# Patient Record
Sex: Male | Born: 1975 | Race: White | Hispanic: No | Marital: Single | Smoking: Former smoker
Health system: Southern US, Community
[De-identification: ages and names within clinical notes are randomized; demographics above are authoritative.]

## PROBLEM LIST (undated history)

## (undated) DIAGNOSIS — F429 Obsessive-compulsive disorder, unspecified: Secondary | ICD-10-CM

## (undated) DIAGNOSIS — Z8619 Personal history of other infectious and parasitic diseases: Secondary | ICD-10-CM

## (undated) DIAGNOSIS — F329 Major depressive disorder, single episode, unspecified: Secondary | ICD-10-CM

## (undated) DIAGNOSIS — F319 Bipolar disorder, unspecified: Secondary | ICD-10-CM

## (undated) DIAGNOSIS — F1910 Other psychoactive substance abuse, uncomplicated: Secondary | ICD-10-CM

## (undated) DIAGNOSIS — F4310 Post-traumatic stress disorder, unspecified: Secondary | ICD-10-CM

## (undated) DIAGNOSIS — F32A Depression, unspecified: Secondary | ICD-10-CM

## (undated) DIAGNOSIS — I10 Essential (primary) hypertension: Secondary | ICD-10-CM

## (undated) DIAGNOSIS — F419 Anxiety disorder, unspecified: Secondary | ICD-10-CM

## (undated) HISTORY — DX: Post-traumatic stress disorder, unspecified: F43.10

## (undated) HISTORY — DX: Obsessive-compulsive disorder, unspecified: F42.9

## (undated) HISTORY — DX: Personal history of other infectious and parasitic diseases: Z86.19

## (undated) HISTORY — DX: Other psychoactive substance abuse, uncomplicated: F19.10

## (undated) HISTORY — DX: Bipolar disorder, unspecified: F31.9

## (undated) HISTORY — PX: OTHER SURGICAL HISTORY: SHX169

---

## 1999-05-26 ENCOUNTER — Inpatient Hospital Stay (HOSPITAL_COMMUNITY): Admission: EM | Admit: 1999-05-26 | Discharge: 1999-05-31 | Payer: Self-pay | Admitting: *Deleted

## 1999-06-01 ENCOUNTER — Other Ambulatory Visit: Admission: RE | Admit: 1999-06-01 | Discharge: 1999-06-20 | Payer: Self-pay

## 2004-08-03 ENCOUNTER — Emergency Department (HOSPITAL_COMMUNITY): Admission: EM | Admit: 2004-08-03 | Discharge: 2004-08-03 | Payer: Self-pay | Admitting: Emergency Medicine

## 2009-08-11 ENCOUNTER — Ambulatory Visit (HOSPITAL_COMMUNITY): Admission: RE | Admit: 2009-08-11 | Discharge: 2009-08-11 | Payer: Self-pay | Admitting: Family Medicine

## 2010-05-13 ENCOUNTER — Encounter: Payer: Self-pay | Admitting: Family Medicine

## 2010-11-06 IMAGING — US US ABDOMEN COMPLETE
1 series · 14 of 25 positions shown · non-contrast
Comparison: None

CLINICAL DATA: Right upper quadrant pain

ULTRASOUND ABDOMEN:
TECHNIQUE: Sonography of upper abdominal structures was performed.

[Series 1: us abdomen complete · 0.32mm/px · 14 of 92 slices shown]
[im 1/92]
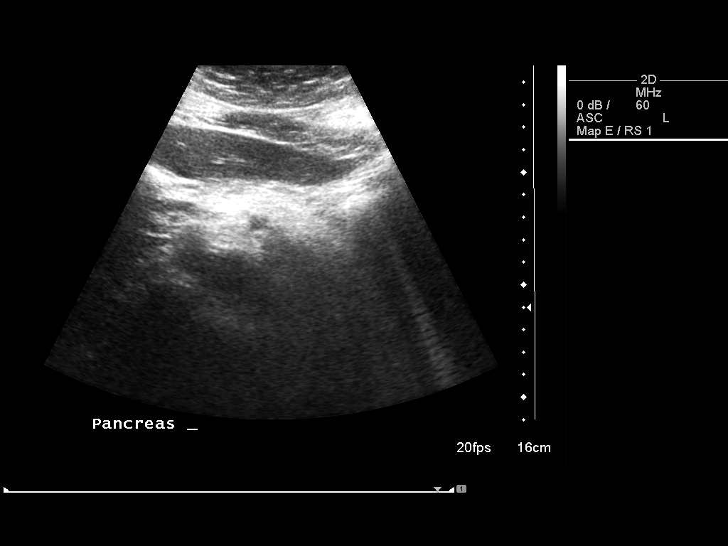
[im 8/92]
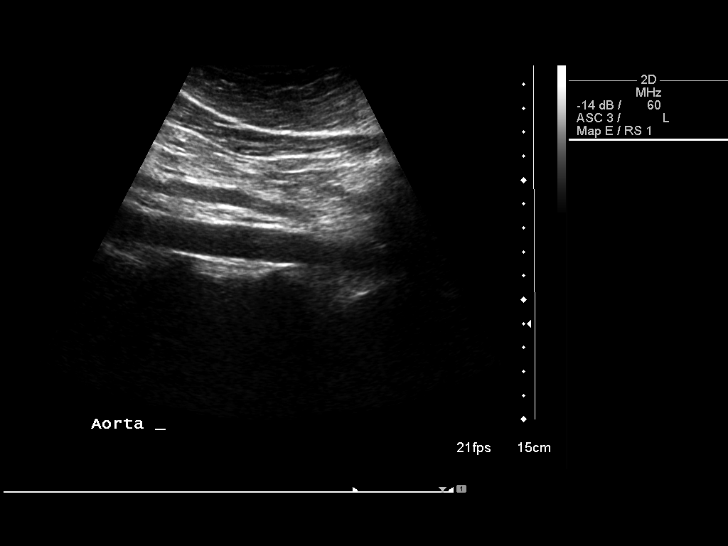
[im 16/92]
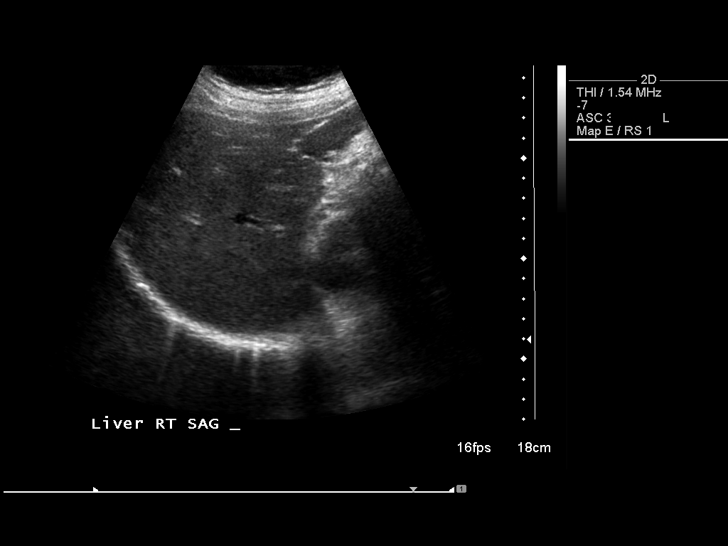
[im 23/92]
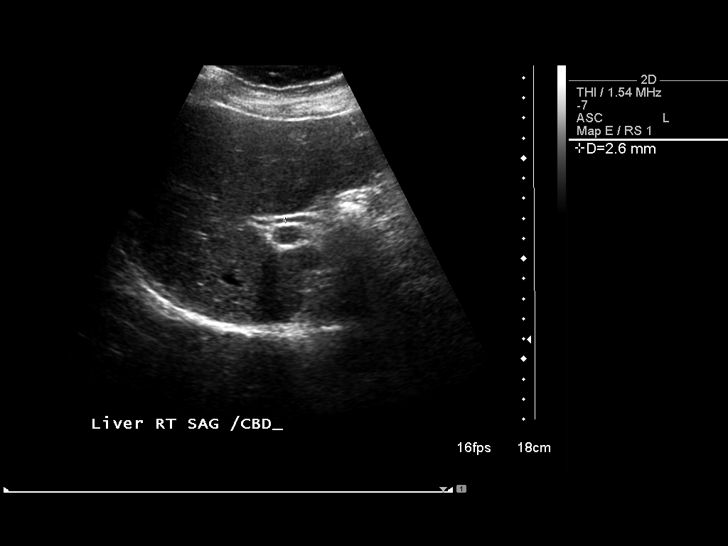
[im 31/92]
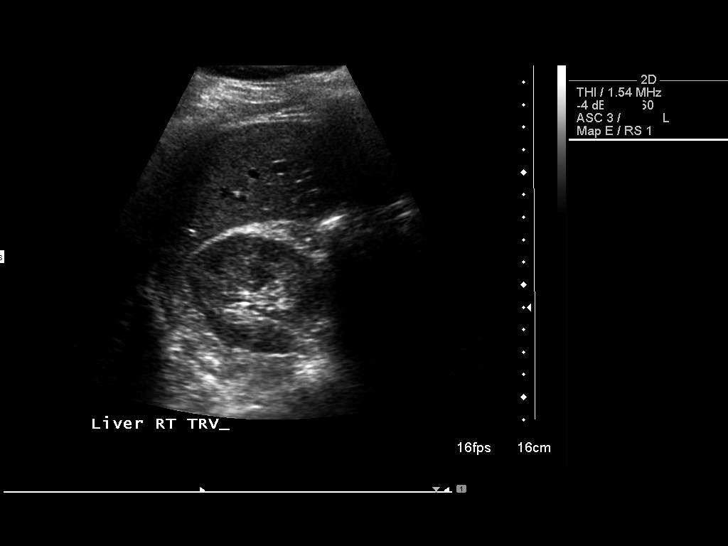
[im 35/92]
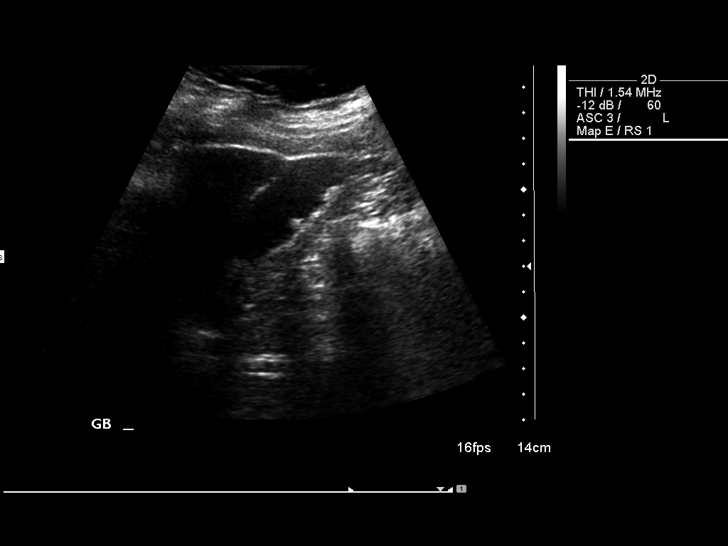
[im 42/92]
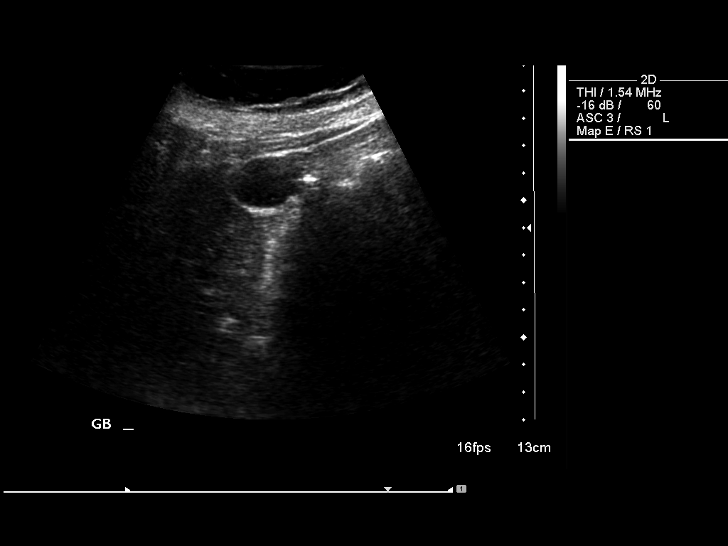
[im 50/92]
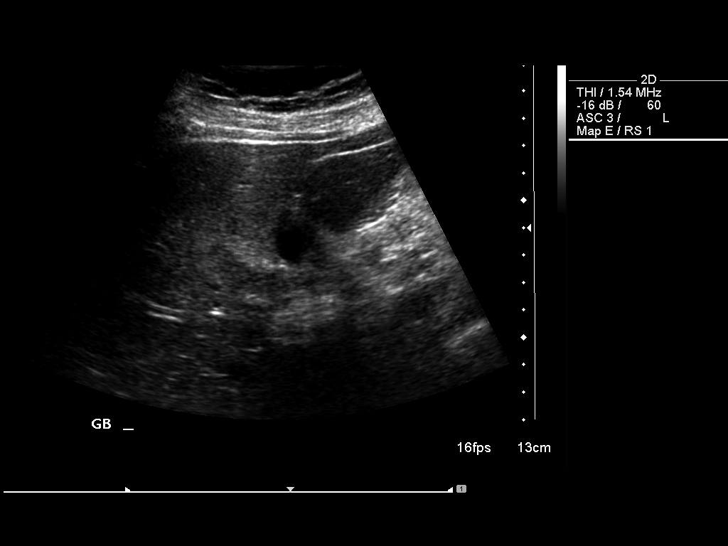
[im 57/92]
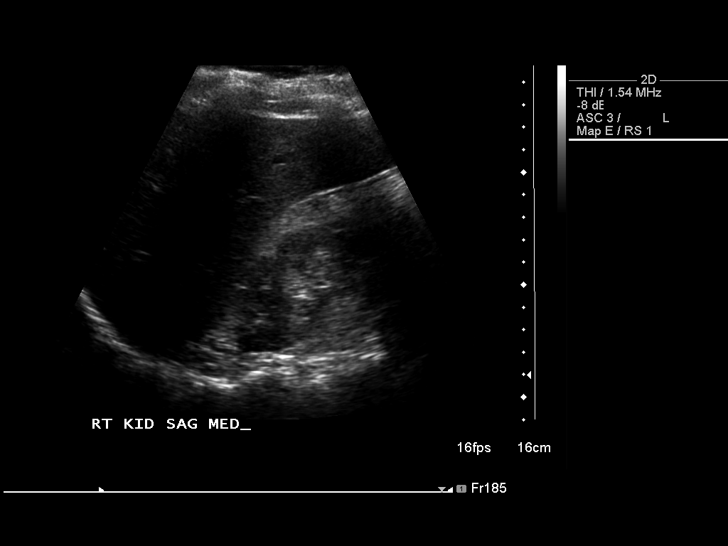
[im 61/92]
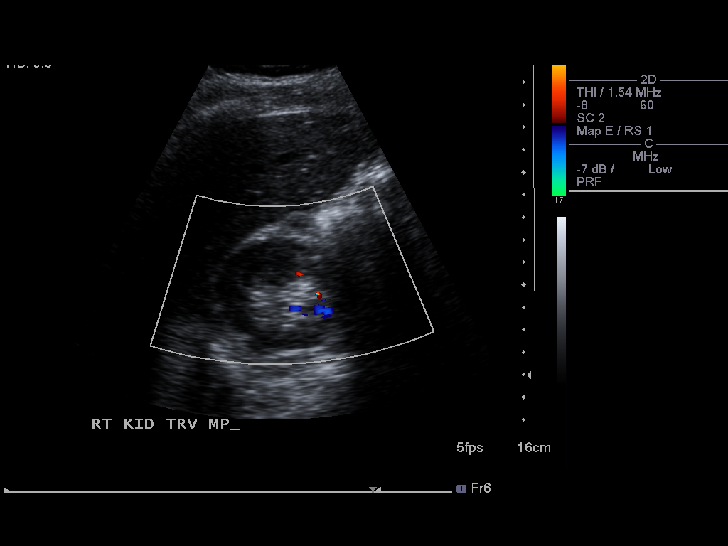
[im 69/92]
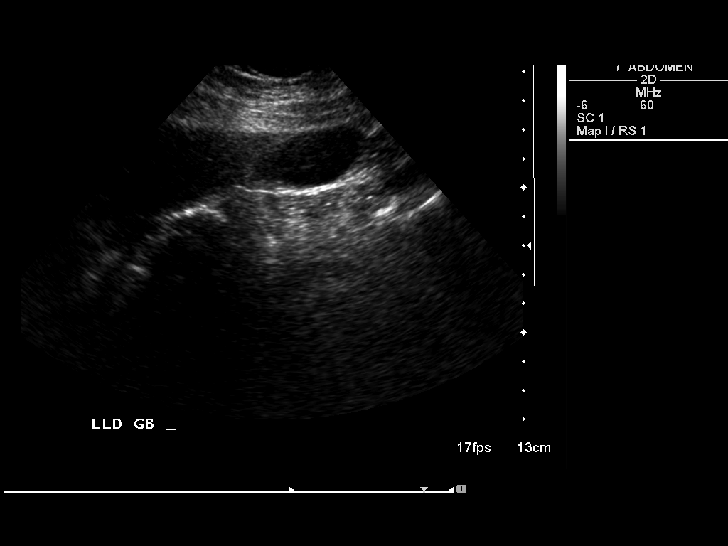
[im 76/92]
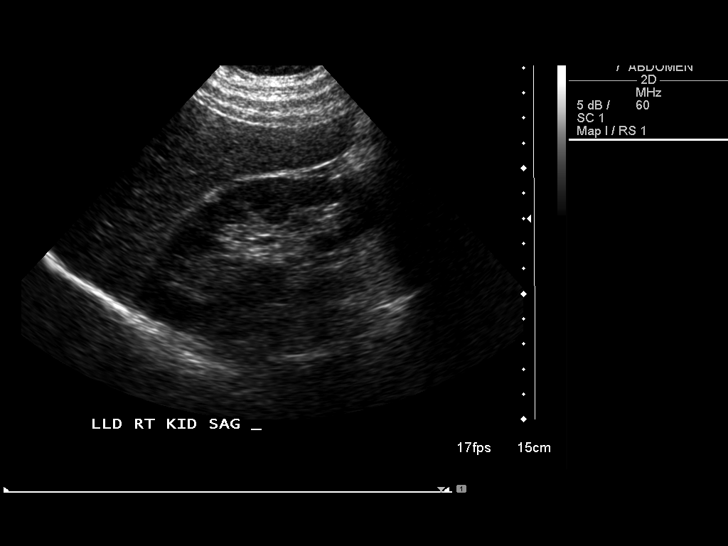
[im 84/92]
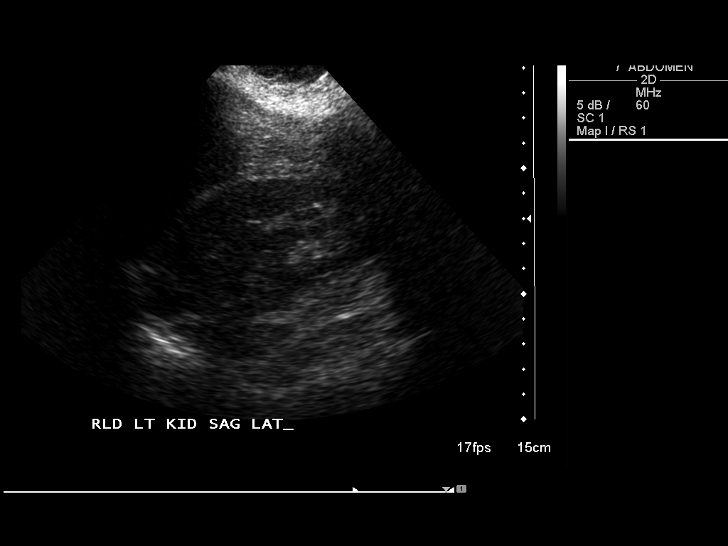
[im 92/92]
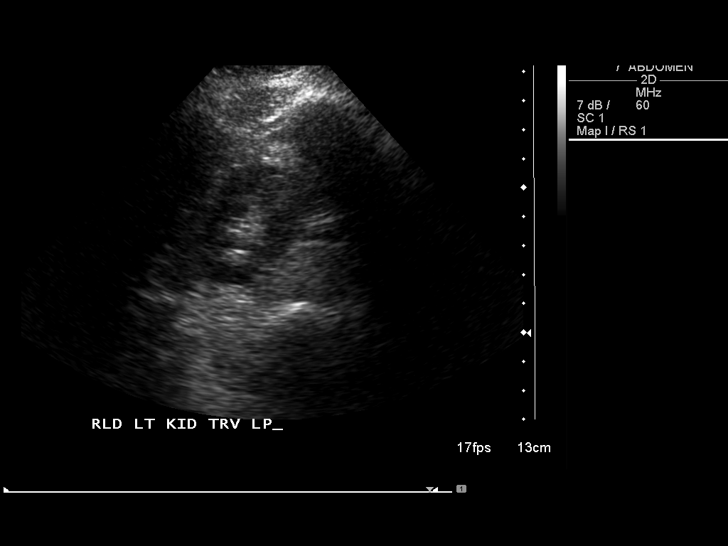

[14 of 25 positions shown; findings below may reference images not displayed]

Gallbladder:  Echogenic foci are seen within the gallbladder lumen,
non mobile.  Several these are associated with subtle posterior
shadowing.  These may represent non mobile calculi or less likely
tiny polyps.  No gallbladder wall thickening or pericholecystic
fluid.  No sonographic Murphy sign.

Common bile duct:  3 mm diameter, normal

Liver:  Normal appearance

IVC:  Unremarkable

Pancreas:  Normal appearance

Spleen:  Normal appearance, 6.8 cm length

Right kidney:  10.7 cm length.  Normal morphology without mass or
hydronephrosis.

Left kidney:  10.3 cm length.  Normal morphology without mass or
hydronephrosis.

Aorta:  Unremarkable

Other:  No free fluid
IMPRESSION: Non mobile echogenic foci within gallbladder lumen, some which are
associated with subtle posterior acoustic shadowing, question non
mobile calculi or polyps.
No evidence of acute cholecystitis or biliary obstruction.
Remainder of exam unremarkable.

## 2012-06-18 ENCOUNTER — Emergency Department (HOSPITAL_COMMUNITY)
Admission: EM | Admit: 2012-06-18 | Discharge: 2012-06-19 | Disposition: A | Discharge status: Other Acute Inpatient Hospital | Payer: BC Managed Care – PPO | Attending: Emergency Medicine | Admitting: Emergency Medicine

## 2012-06-18 ENCOUNTER — Encounter (HOSPITAL_COMMUNITY): Payer: Self-pay | Admitting: *Deleted

## 2012-06-18 DIAGNOSIS — F17200 Nicotine dependence, unspecified, uncomplicated: Secondary | ICD-10-CM | POA: Insufficient documentation

## 2012-06-18 DIAGNOSIS — F3289 Other specified depressive episodes: Secondary | ICD-10-CM | POA: Insufficient documentation

## 2012-06-18 DIAGNOSIS — R51 Headache: Secondary | ICD-10-CM | POA: Insufficient documentation

## 2012-06-18 DIAGNOSIS — R45851 Suicidal ideations: Secondary | ICD-10-CM

## 2012-06-18 DIAGNOSIS — I10 Essential (primary) hypertension: Secondary | ICD-10-CM | POA: Insufficient documentation

## 2012-06-18 DIAGNOSIS — F329 Major depressive disorder, single episode, unspecified: Secondary | ICD-10-CM

## 2012-06-18 DIAGNOSIS — T1491XA Suicide attempt, initial encounter: Secondary | ICD-10-CM

## 2012-06-18 DIAGNOSIS — F411 Generalized anxiety disorder: Secondary | ICD-10-CM | POA: Insufficient documentation

## 2012-06-18 HISTORY — DX: Essential (primary) hypertension: I10

## 2012-06-18 HISTORY — DX: Anxiety disorder, unspecified: F41.9

## 2012-06-18 HISTORY — DX: Major depressive disorder, single episode, unspecified: F32.9

## 2012-06-18 HISTORY — DX: Depression, unspecified: F32.A

## 2012-06-18 LAB — CBC WITH DIFFERENTIAL/PLATELET
Basophils Relative: 0 % (ref 0–1)
Eosinophils Absolute: 0.3 10*3/uL (ref 0.0–0.7)
HCT: 43.4 % (ref 39.0–52.0)
Lymphs Abs: 2.2 10*3/uL (ref 0.7–4.0)
Monocytes Absolute: 0.3 10*3/uL (ref 0.1–1.0)
Neutro Abs: 4.1 10*3/uL (ref 1.7–7.7)
Platelets: 164 10*3/uL (ref 150–400)
WBC: 6.8 10*3/uL (ref 4.0–10.5)

## 2012-06-18 LAB — RAPID URINE DRUG SCREEN, HOSP PERFORMED
Amphetamines: NOT DETECTED
Opiates: NOT DETECTED
Tetrahydrocannabinol: NOT DETECTED

## 2012-06-18 LAB — BASIC METABOLIC PANEL
CO2: 27 mEq/L (ref 19–32)
Calcium: 9.2 mg/dL (ref 8.4–10.5)
Chloride: 99 mEq/L (ref 96–112)
GFR calc Af Amer: >90 mL/min (ref >90)
GFR calc non Af Amer: >90 mL/min (ref >90)

## 2012-06-18 LAB — ETHANOL: Alcohol, Ethyl (B): <11 mg/dL (ref 0–11)

## 2012-06-18 MED ORDER — ALUM & MAG HYDROXIDE-SIMETH 200-200-20 MG/5ML PO SUSP: 30.0000 mL | ORAL | Status: DC | PRN

## 2012-06-18 MED ORDER — ONDANSETRON HCL 4 MG PO TABS: 4.0000 mg | ORAL_TABLET | Freq: Three times a day (TID) | ORAL | Status: DC | PRN

## 2012-06-18 MED ORDER — LORAZEPAM 1 MG PO TABS: 1.0000 mg | ORAL_TABLET | Freq: Three times a day (TID) | ORAL | Status: DC | PRN

## 2012-06-18 MED ORDER — IBUPROFEN 400 MG PO TABS: 400.0000 mg | ORAL_TABLET | Freq: Three times a day (TID) | ORAL | Status: DC | PRN

## 2012-06-18 MED ORDER — ZOLPIDEM TARTRATE 5 MG PO TABS: 5.0000 mg | ORAL_TABLET | Freq: Every evening | ORAL | Status: DC | PRN

## 2012-06-18 MED ORDER — ACETAMINOPHEN 325 MG PO TABS: 650.0000 mg | ORAL_TABLET | ORAL | Status: DC | PRN

## 2012-06-18 MED ORDER — NICOTINE 21 MG/24HR TD PT24: 21.0000 mg | MEDICATED_PATCH | Freq: Every day | TRANSDERMAL | Status: DC | PRN

## 2012-06-18 NOTE — ED Notes (Signed)
Barbara from [facility] called to accept pt but cannot take the patient without the petition. Per Samson Frederic if the police officer cannot locate the petition we have to wait for the clerk of court to get [place] a copy in the morning.
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
## 2012-06-18 NOTE — ED Notes (Signed)
Pt here in custody of police with IVC papers.  Alert, NAD

## 2012-06-18 NOTE — ED Notes (Signed)
Ella from [facility] unable to locate pt's petition, PD called to locate, if unable to locate the clerk of court will have to provide a copy tomorrow before information can be faxed.

## 2012-06-18 NOTE — [facility] Counselor (Signed)
Patient accepted to [facility] by Dr. Ferol Luz to Dr. Daleen Bo and will be going into 502-2.

## 2012-06-18 NOTE — ED Notes (Signed)
Belongings bagged by deanna, nt, wanded by security, sitter at bedside and room cleared for safety.

## 2012-06-19 ENCOUNTER — Inpatient Hospital Stay (HOSPITAL_COMMUNITY)
Admission: EM | Admit: 2012-06-19 | Discharge: 2012-06-26 | DRG: 430 | Disposition: A | Discharge status: Home / Self Care | Payer: BC Managed Care – PPO | Source: Intra-hospital | Attending: Psychiatry | Admitting: Psychiatry

## 2012-06-19 ENCOUNTER — Encounter (HOSPITAL_COMMUNITY): Payer: Self-pay | Admitting: *Deleted

## 2012-06-19 DIAGNOSIS — I10 Essential (primary) hypertension: Secondary | ICD-10-CM | POA: Diagnosis present

## 2012-06-19 DIAGNOSIS — F331 Major depressive disorder, recurrent, moderate: Secondary | ICD-10-CM | POA: Diagnosis present

## 2012-06-19 DIAGNOSIS — R45851 Suicidal ideations: Secondary | ICD-10-CM

## 2012-06-19 DIAGNOSIS — F411 Generalized anxiety disorder: Secondary | ICD-10-CM | POA: Diagnosis present

## 2012-06-19 DIAGNOSIS — F4310 Post-traumatic stress disorder, unspecified: Secondary | ICD-10-CM | POA: Diagnosis present

## 2012-06-19 DIAGNOSIS — F332 Major depressive disorder, recurrent severe without psychotic features: Principal | ICD-10-CM | POA: Diagnosis present

## 2012-06-19 DIAGNOSIS — Z79899 Other long term (current) drug therapy: Secondary | ICD-10-CM

## 2012-06-19 MED ORDER — MAGNESIUM HYDROXIDE 400 MG/5ML PO SUSP
30.0000 mL | Freq: Every day | ORAL | Status: DC | PRN
  Administered 2012-06-22: 30 mL via ORAL

## 2012-06-19 MED ORDER — SERTRALINE HCL 25 MG PO TABS
25.0000 mg | ORAL_TABLET | Freq: Every day | ORAL | Status: DC
  Administered 2012-06-19 – 2012-06-22 (×4): 25 mg via ORAL
  Filled 2012-06-19 (×5): qty 1

## 2012-06-19 MED ORDER — ACETAMINOPHEN 325 MG PO TABS: 650.0000 mg | ORAL_TABLET | Freq: Four times a day (QID) | ORAL | Status: DC | PRN

## 2012-06-19 MED ORDER — TRAZODONE HCL 50 MG PO TABS
50.0000 mg | ORAL_TABLET | Freq: Every evening | ORAL | Status: DC | PRN
  Administered 2012-06-19 – 2012-06-25 (×12): 50 mg via ORAL
  Filled 2012-06-19 (×20): qty 1

## 2012-06-19 MED ORDER — MAGNESIUM HYDROXIDE 400 MG/5ML PO SUSP: 30.0000 mL | Freq: Every day | ORAL | Status: DC | PRN

## 2012-06-19 MED ORDER — ENSURE COMPLETE PO LIQD
237.0000 mL | Freq: Two times a day (BID) | ORAL | Status: DC
  Administered 2012-06-20: 237 mL via ORAL

## 2012-06-19 MED ORDER — POTASSIUM CHLORIDE CRYS ER 10 MEQ PO TBCR
10.0000 meq | EXTENDED_RELEASE_TABLET | Freq: Two times a day (BID) | ORAL | Status: DC
  Administered 2012-06-19 – 2012-06-26 (×14): 10 meq via ORAL
  Filled 2012-06-19 (×18): qty 1

## 2012-06-19 MED ORDER — HYDROCHLOROTHIAZIDE 25 MG PO TABS
25.0000 mg | ORAL_TABLET | Freq: Every day | ORAL | Status: DC
  Administered 2012-06-19 – 2012-06-26 (×8): 25 mg via ORAL
  Filled 2012-06-19 (×8): qty 1
  Filled 2012-06-19: qty 4
  Filled 2012-06-19: qty 1

## 2012-06-19 MED ORDER — HYDROXYZINE HCL 25 MG PO TABS: 25.0000 mg | ORAL_TABLET | Freq: Three times a day (TID) | ORAL | Status: DC | PRN

## 2012-06-19 MED ORDER — ACETAMINOPHEN 325 MG PO TABS
650.0000 mg | ORAL_TABLET | Freq: Four times a day (QID) | ORAL | Status: DC | PRN
  Administered 2012-06-22 – 2012-06-26 (×5): 650 mg via ORAL

## 2012-06-19 MED ORDER — ALUM & MAG HYDROXIDE-SIMETH 200-200-20 MG/5ML PO SUSP: 30.0000 mL | ORAL | Status: DC | PRN

## 2012-06-19 MED ORDER — LACTASE 3000 UNITS PO TABS
6000.0000 [IU] | ORAL_TABLET | Freq: Three times a day (TID) | ORAL | Status: DC
  Administered 2012-06-20 – 2012-06-23 (×2): 6000 [IU] via ORAL
  Filled 2012-06-19 (×25): qty 2

## 2012-06-19 MED ORDER — HYDROXYZINE HCL 25 MG PO TABS
25.0000 mg | ORAL_TABLET | Freq: Four times a day (QID) | ORAL | Status: DC | PRN
  Administered 2012-06-19 – 2012-06-20 (×2): 25 mg via ORAL

## 2012-06-19 NOTE — ED Notes (Signed)
Patient left with [employer] for transport to [facility]. No distress upon departure.
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
## 2012-06-19 NOTE — Progress Notes (Signed)
Writer observed patient up in the dayroom watching tv but no interaction with peers. Patient attended group this evening and participated. Patient reports having had a bad day and he has been feeling really low and has had trouble sleeping. Patient was informed of medications available and received a dose of visteril for his anxiety. Patient reports passive si and verbally contracts for safety. Patient reports positive for visual hallucinations stating he sees things that are not there and feels as if he is in a dream and can't wake up. Patient offered support and encouragement, will monitor effectiveness of medications. Safety maintained on unit, will continue to monitor.

## 2012-06-19 NOTE — ED Notes (Signed)
Petition missing from IVC paperwork, needed for transfer to [facility]. [facility] took out IVC paperwork prior to patient's arrival to ED. Diane with [facility] made aware and will be in to ED to find paperwork from [facility].

## 2012-06-19 NOTE — Progress Notes (Signed)
Nutrition Brief Note  Patient identified on the Malnutrition Screening Tool (MST) Report  Body mass index is 21.45 kg/(m^2). Patient's weight is WNL based on current BMI.   Wt Readings from Last 10 Encounters:  06/19/12 174 lb (78.926 kg)  06/18/12 160 lb (72.576 kg)    Patient reports a 25 lb weight loss over the past few weeks due to decreased intake secondary to poor appetite from depression.  Patient is lactose intolerant and does not tolerate some fruits.  Followed a low sodium diet prior to admit to help with blood pressure.  At home was forcing himself to eat to avoiding fainting.  Current diet order is low sodium, patient is consuming small amounts of meals at this time. Labs and medications reviewed.   Will order Ensure Complete po BID, each supplement provides 350 kcal and 13 grams of protein.  No further nutrition interventions warranted at this time. If further nutrition issues arise, please consult RD.   Oran Rein, RD, LDN Clinical Inpatient Dietitian Pager:  [phone] Weekend and after hours pager:  [phone]

## 2012-06-19 NOTE — ED Notes (Signed)
Walked with patient to restroom. Patient states that he was not hungry. Is ver y quite and cooperative at this time.
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
## 2012-06-19 NOTE — ED Provider Notes (Signed)
  Physical Exam  BP 160/97  Pulse 79  Temp(Src) 97.9 F (36.6 C) (Oral)  Resp 20  Ht 6' 3.5" (1.918 m)  Wt 160 lb (72.576 kg)  BMI 19.73 kg/m2  SpO2 95%  Physical Exam  ED Course  Procedures  MDM Pt accepted by [facility], stable for transfer      Vida Roller, MD 06/19/12 0800
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
## 2012-06-19 NOTE — ED Notes (Signed)
[facility] call for transport.

## 2012-06-19 NOTE — Progress Notes (Signed)
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

## 2012-06-20 DIAGNOSIS — F332 Major depressive disorder, recurrent severe without psychotic features: Principal | ICD-10-CM

## 2012-06-20 MED ORDER — LISINOPRIL 5 MG PO TABS
5.0000 mg | ORAL_TABLET | Freq: Every day | ORAL | Status: DC
  Administered 2012-06-20 – 2012-06-21 (×2): 5 mg via ORAL
  Filled 2012-06-20 (×4): qty 1

## 2012-06-20 MED ORDER — HYDROXYZINE HCL 25 MG PO TABS
50.0000 mg | ORAL_TABLET | Freq: Three times a day (TID) | ORAL | Status: DC
  Administered 2012-06-20 – 2012-06-26 (×25): 50 mg via ORAL
  Filled 2012-06-20: qty 32
  Filled 2012-06-20: qty 1
  Filled 2012-06-20: qty 32
  Filled 2012-06-20 (×6): qty 1
  Filled 2012-06-20: qty 32
  Filled 2012-06-20 (×19): qty 1
  Filled 2012-06-20: qty 32
  Filled 2012-06-20 (×4): qty 1

## 2012-06-20 MED ORDER — HYDROXYZINE HCL 50 MG PO TABS: 50.0000 mg | ORAL_TABLET | Freq: Four times a day (QID) | ORAL | Status: DC | PRN

## 2012-06-20 NOTE — Progress Notes (Signed)
Patrick Bowen is seen [facility] on the [address] hall today.Marland KitchenHe is quiet. He does not initiate conversation with anybody, but will speak with whomever when he is spoken to. He has attended his groups today, he completed his self inventory and on it he wrote he cont to have " off and on" SI, he contracted with this Clinical research associate for safety and he stated his depression and hopelessness were " 8/ 3 ".   A He is started on Prinivil 5 mg PO QD for HTN and has tolerated it thus far.   R Safety is in place and POC cont with therapeutic relationship fostered.

## 2012-06-20 NOTE — Progress Notes (Deleted)
Goals  Group Note  Date:  06/20/2012 Time: 0930   Group Topic/Focus:  Identifying Goals :   The focus of this group is to help patients identify their personal goals, begin to make an action plan on how to accomplish these goals and  To live on his own. Participation Level:  Minimal  Participation Quality:  Attentive  Affect:  Blunted  Cognitive:  Appropriate  Insight:  Developing/Improving  Engagement in Group:  Engaged  Additional Comments:    06/20/2012,2:30 PM Savalas Monje, Joie Bimler

## 2012-06-20 NOTE — Progress Notes (Signed)
[facility] MD Progress Note  06/20/2012 9:27 AM Patrick Bowen  MRN:  [id number] Subjective:  Reports sleeping well but has no appetite but "forced myself to eat this morning." 8/10 depression with anxiety--vistaril increased, suicidal ideations at times (passive), blood pressure medications being adjusted--he wasn't on any prior to admission Diagnosis:   Axis I: Generalized Anxiety Disorder and Major Depression, Recurrent severe Axis II: Deferred Axis III:  Past Medical History  Diagnosis Date  . Hypertension   . Depression   . Anxiety    Axis IV: occupational problems, other psychosocial or environmental problems, problems related to social environment and problems with primary support group Axis V: 41-50 serious symptoms  ADL's:  Intact  Sleep: Good  Appetite:  Poor  Suicidal Ideation:  Plan:  none Intent:  none Means:  none Homicidal Ideation:  Denies  Psychiatric Specialty Exam: Review of Systems  Constitutional: Negative.   HENT: Negative.   Eyes: Negative.   Respiratory: Negative.   Cardiovascular: Negative.   Gastrointestinal: Negative.   Genitourinary: Negative.   Musculoskeletal: Negative.   Skin: Negative.   Neurological: Negative.   Endo/Heme/Allergies: Negative.   Psychiatric/Behavioral: Positive for depression and suicidal ideas. The patient is nervous/anxious.     Blood pressure 179/114, pulse 108, temperature 97.7 F (36.5 C), temperature source Oral, resp. rate 16, height 6' 3.5" (1.918 m), weight 78.926 kg (174 lb).Body mass index is 21.45 kg/(m^2).  General Appearance: Casual  Eye Contact::  Fair  Speech:  Normal Rate  Volume:  Normal  Mood:  Anxious and Depressed  Affect:  Congruent  Thought Process:  Coherent  Orientation:  Full (Time, Place, and Person)  Thought Content:  WDL  Suicidal Thoughts:  Yes.  without intent/plan  Homicidal Thoughts:  No  Memory:  Immediate;   Fair Recent;   Fair Remote;   Fair  Judgement:  Fair  Insight:  Fair   Psychomotor Activity:  Decreased  Concentration:  Fair  Recall:  Fair  Akathisia:  No  Handed:  Right  AIMS (if indicated):     Assets:  Physical Health Resilience Social Support  Sleep:  Number of Hours: 6   Current Medications: Current Facility-Administered Medications  Medication Dose Route Frequency Provider Last Rate Last Dose  . acetaminophen (TYLENOL) tablet 650 mg  650 mg Oral Q6H PRN Nanine Means, NP      . acetaminophen (TYLENOL) tablet 650 mg  650 mg Oral Q6H PRN Mickeal Skinner, MD      . alum & mag hydroxide-simeth (MAALOX/MYLANTA) 200-200-20 MG/5ML suspension 30 mL  30 mL Oral Q4H PRN Nanine Means, NP      . alum & mag hydroxide-simeth (MAALOX/MYLANTA) 200-200-20 MG/5ML suspension 30 mL  30 mL Oral Q4H PRN Mickeal Skinner, MD      . feeding supplement (ENSURE COMPLETE) liquid 237 mL  237 mL Oral BID BM Jeoffrey Massed, RD      . hydrochlorothiazide (HYDRODIURIL) tablet 25 mg  25 mg Oral Daily Nanine Means, NP   25 mg at 06/20/12 0701  . hydrOXYzine (ATARAX/VISTARIL) tablet 25 mg  25 mg Oral Q6H PRN Nanine Means, NP   25 mg at 06/20/12 0701  . hydrOXYzine (ATARAX/VISTARIL) tablet 25 mg  25 mg Oral TID PRN Mickeal Skinner, MD      . lactase (LACTAID) tablet 6,000 Units  6,000 Units Oral TID WC Mickeal Skinner, MD      . lisinopril (PRINIVIL,ZESTRIL) tablet 5 mg  5 mg Oral Daily Nanine Means, NP      .  magnesium hydroxide (MILK OF MAGNESIA) suspension 30 mL  30 mL Oral Daily PRN Nanine Means, NP      . magnesium hydroxide (MILK OF MAGNESIA) suspension 30 mL  30 mL Oral Daily PRN Mickeal Skinner, MD      . potassium chloride (K-DUR,KLOR-CON) CR tablet 10 mEq  10 mEq Oral BID Nanine Means, NP   10 mEq at 06/20/12 0842  . sertraline (ZOLOFT) tablet 25 mg  25 mg Oral Daily Nanine Means, NP   25 mg at 06/20/12 0843  . traZODone (DESYREL) tablet 50 mg  50 mg Oral QHS,MR X 1 Mickeal Skinner, MD   50 mg at 06/19/12 2135    Lab Results:  Results for orders placed during the hospital  encounter of 06/18/12 (from the past 48 hour(s))  CBC WITH DIFFERENTIAL     Status: None   Collection Time    06/18/12  5:32 PM      Result Value Range   WBC 6.8  4.0 - 10.5 K/uL   RBC 4.54  4.22 - 5.81 MIL/uL   Hemoglobin 14.8  13.0 - 17.0 g/dL   HCT 16.1  09.6 - 04.5 %   MCV 95.6  78.0 - 100.0 fL   MCH 32.6  26.0 - 34.0 pg   MCHC 34.1  30.0 - 36.0 g/dL   RDW 40.9  81.1 - 91.4 %   Platelets 164  150 - 400 K/uL   Neutrophils Relative 60  43 - 77 %   Neutro Abs 4.1  1.7 - 7.7 K/uL   Lymphocytes Relative 32  12 - 46 %   Lymphs Abs 2.2  0.7 - 4.0 K/uL   Monocytes Relative 4  3 - 12 %   Monocytes Absolute 0.3  0.1 - 1.0 K/uL   Eosinophils Relative 4  0 - 5 %   Eosinophils Absolute 0.3  0.0 - 0.7 K/uL   Basophils Relative 0  0 - 1 %   Basophils Absolute 0.0  0.0 - 0.1 K/uL  BASIC METABOLIC PANEL     Status: None   Collection Time    06/18/12  5:32 PM      Result Value Range   Sodium 137  135 - 145 mEq/L   Potassium 3.5  3.5 - 5.1 mEq/L   Chloride 99  96 - 112 mEq/L   CO2 27  19 - 32 mEq/L   Glucose, Bld 89  70 - 99 mg/dL   BUN 13  6 - 23 mg/dL   Creatinine, Ser 7.82  0.50 - 1.35 mg/dL   Calcium 9.2  8.4 - 95.6 mg/dL   GFR calc non Af Amer >90  >90 mL/min   GFR calc Af Amer >90  >90 mL/min   Comment:            The eGFR has been calculated     using the CKD EPI equation.     This calculation has not been     validated in all clinical     situations.     eGFR's persistently     <90 mL/min signify     possible Chronic Kidney Disease.  ETHANOL     Status: None   Collection Time    06/18/12  5:32 PM      Result Value Range   Alcohol, Ethyl (B) <11  0 - 11 mg/dL   Comment:            LOWEST DETECTABLE LIMIT FOR  SERUM ALCOHOL IS 11 mg/dL     FOR MEDICAL PURPOSES ONLY  URINE RAPID DRUG SCREEN (HOSP PERFORMED)     Status: None   Collection Time    06/18/12  6:20 PM      Result Value Range   Opiates NONE DETECTED  NONE DETECTED   Cocaine NONE DETECTED  NONE DETECTED    Benzodiazepines NONE DETECTED  NONE DETECTED   Amphetamines NONE DETECTED  NONE DETECTED   Tetrahydrocannabinol NONE DETECTED  NONE DETECTED   Barbiturates NONE DETECTED  NONE DETECTED   Comment:            DRUG SCREEN FOR MEDICAL PURPOSES     ONLY.  IF CONFIRMATION IS NEEDED     FOR ANY PURPOSE, NOTIFY LAB     WITHIN 5 DAYS.                LOWEST DETECTABLE LIMITS     FOR URINE DRUG SCREEN     Drug Class       Cutoff (ng/mL)     Amphetamine      1000     Barbiturate      200     Benzodiazepine   200     Tricyclics       300     Opiates          300     Cocaine          300     THC              50    Physical Findings: AIMS:  , ,  ,  ,    CIWA:    COWS:     Treatment Plan Summary: Daily contact with patient to assess and evaluate symptoms and progress in treatment Medication management  Plan:  Review of chart, vital signs, medications, and notes.  2-4 days LOS from this point. 1-Individual and group therapy 2-Medication management for depression and anxiety:  Medications reviewed with the patient and she stated he did not voice any adverse effects, vistaril increased for anxiety 3-Coping skills for depression and anxiety 4-Continue crisis stabilization and management 5-Address health issues--monitoring blood pressure, added HCTZ yesterday and lisinopril today--will continue to monitor 6-Treatment plan in progress to prevent relapse of depression and anxiety  Medical Decision Making Problem Points:  Established problem, stable/improving (1) and Review of psycho-social stressors (1) Data Points:  Review of medication regiment & side effects (2)  I certify that inpatient services furnished can reasonably be expected to improve the patient's condition.   Nanine Means, PMH-NP 06/20/2012, 9:27 AM

## 2012-06-20 NOTE — Progress Notes (Signed)
[facility] Group Notes:  (Nursing/MHT/Case Management/Adjunct)  Date:  06/19/2012 Time:  2000  Type of Therapy:  Psychoeducational Skills  Participation Level:  Minimal  Participation Quality:  Inattentive  Affect:  Depressed  Cognitive:  Appropriate  Insight:  Lacking  Engagement in Group:  Lacking  Modes of Intervention:  Education  Summary of Progress/Problems: The patient described his day as having been "thoroughly crappy". He would not explain any further. His goal for tomorrow is to get more rest.   Nasier Thumm S 06/20/2012, 12:29 AM

## 2012-06-20 NOTE — Progress Notes (Signed)
Psychoeducational Group Note  Date:  06/20/2012 Time: 1015  Group Topic/Focus:   Identifying Needs:   The focus of this group is to help patients identify their personal needs that have been historically problematic and identify healthy behaviors to address their needs.  Participation Level:  Active  Participation Quality: fair  Affect: flat  Cognitive: in tact   Insight : fair  Engagement in Group: engaged  Additional Comments:   PDuke RN [facility]
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
## 2012-06-20 NOTE — Clinical Social Work Note (Signed)
[facility] Group Notes:  (Clinical Social Work)  06/20/2012   3:00-4:00PM  Summary of Progress/Problems:   The main focus of today's process group was for the patient to identify something in their life that led to their hospitalization that they would like to change, then to discuss their motivation to change.  The Stages of Change were explained to the group, then each patient identified where they are in that process and what actions they are taking toward change within that stage.  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  He stated that he wants to change how bad he feels, and how he cannot get rid of memories in his head.  He tried to go to other people in his life to talk this out, but they stated that he was hurt by the events he remembers, and if he shares them, it will hurt them as well and they do not want that.  He stated he has attempted suicide 2 times in the past 2 weeks.  He talked (calmly) of how angry he is at how he was brought into the hospital involuntarily in handcuffs.  He was willing to self-admit, and does not understand how he had to be handcuffed to a hospital gurney, then go into a jail cell and then be driven to the hospital when his father volunteered to bring him to the hospital voluntarily.  Type of Therapy:  Process Group  Participation Level:  Active  Participation Quality:  Appropriate, Attentive and Sharing  Affect:  Blunted, Depressed and Irritable  Cognitive:  Appropriate and Oriented  Insight:  Developing/Improving  Engagement in Therapy:  Developing/Improving  Modes of Intervention:  Clarification, Support and Processing, Exploration, Discussion   Ambrose Mantle, LCSW 06/20/2012, 4:21 PM

## 2012-06-20 NOTE — Progress Notes (Signed)
Goals  Group Note  Date:  06/20/2012 Time:  0930  Group Topic/Focus:  Identifying  Goals : The group is focused on helping patients identify goals they wawnt to begin to work on in their lives as well as motivating them to become engaged in their POC.  Participation Level:  passive   Participation Quality: fair  Affect: flat  Cognitive:intact     Insight:  some  Engagement in Group: engaged  Additional Comments:   PDuke RN [facility] 06/20/2012  1444

## 2012-06-21 DIAGNOSIS — F411 Generalized anxiety disorder: Secondary | ICD-10-CM

## 2012-06-21 DIAGNOSIS — F329 Major depressive disorder, single episode, unspecified: Secondary | ICD-10-CM

## 2012-06-21 DIAGNOSIS — F4310 Post-traumatic stress disorder, unspecified: Secondary | ICD-10-CM

## 2012-06-21 MED ORDER — LISINOPRIL 10 MG PO TABS
10.0000 mg | ORAL_TABLET | Freq: Every day | ORAL | Status: DC
  Administered 2012-06-22 – 2012-06-26 (×5): 10 mg via ORAL
  Filled 2012-06-21 (×2): qty 1
  Filled 2012-06-21: qty 4
  Filled 2012-06-21 (×4): qty 1

## 2012-06-21 NOTE — Progress Notes (Addendum)
D Cleatis is seen out in the milieu..he s quiet, shy, avoiding staff and other patients. HE has a flat, blunted affect. HE does not like eye contact and will tell you this.  He says his biggest problem is his underlying anxiety and he gets physically uncomfortable when he experiences this anxiety.He takes his medications as ordered by the MD, he attends his groups as planned, he pays attention to the discussion and he says he is ready to " make the changes" he needs to make, in order to become healtheir.   A He does not complete his self invnetory but states he is safe and denies active SI to this nurse. HE is able to contract with this Clinical research associate for safety and he says " I am ok..."   R Safety is in place and POC cont.

## 2012-06-21 NOTE — Progress Notes (Signed)
[facility] Group Notes:  (Nursing/MHT/Case Management/Adjunct)  Date:  06/21/2012  Time:  2000  Type of Therapy:  Psychoeducational Skills  Participation Level:  Active  Participation Quality:  Attentive and Monopolizing  Affect:  Appropriate  Cognitive:  Appropriate  Insight:  Good  Engagement in Group:  Monopolizing  Modes of Intervention:  Education  Summary of Progress/Problems: The patient mentioned in group that his day was both better and worse for him. He verbalized that he experienced suicidal thoughts,but that he managed to work through the situation. He copes with his suicidal thoughts by thinking about his pet as well as the different places where [place] lived [place] in the [place]. His goal for tomorrow is to work on his suicidal thoughts and to begin creating a list of future goals.   Hazle Coca S 06/21/2012, 10:44 PM

## 2012-06-21 NOTE — Clinical Social Work Note (Signed)
[facility] Group Notes:  (Clinical Social Work)  06/21/2012   3:00-4:00PM  Summary of Progress/Problems:    The main focus of today's process group was to define "support" and describe what healthy supports are.  We then discussed how and why to increase patient supports, using motivational interviewing.  An emphasis was placed on using counselor, doctor, therapy groups, self-help groups and problem-specific support groups to expand supports.   The patient expressed that he has provided support to others by listening to them and not being judgmental.  Others have supported him by listening and being honest.  He states that what he needs is to be able to make a commitment about the future that is not based on the pain from his past.  CSW suggested that this would be something that could be discussed in therapy on a regular basis, and he agreed.  He stated that he also needs to be honest with himself.  Type of Therapy:  Process Group  Participation Level:  Active  Participation Quality:  Attentive and Sharing  Affect:  Blunted and Depressed  Cognitive:  Oriented  Insight:  Engaged  Engagement in Therapy:  Engaged  Modes of Intervention:  Education,  Teacher, English as a foreign language, Exploration, Discussion   Ambrose Mantle, LCSW 06/21/2012, 4:07 PM

## 2012-06-21 NOTE — Progress Notes (Signed)
Group Topic/Focus:  Gratefulness:  The focus of this group is to help patients identify what two things they are most grateful for in their lives. What helps ground them and to center them on their work to their recovery.  Participation Level:  Active  Participation Quality:  Attentive  Affect:  Appropriate  Cognitive:  Alert  Insight:  Engaged  Engagement in Group:  Engaged  Additional Comments:  Pt stated he was grateful for the gift of time. For what has been and for what is yet to come.  Dione Housekeeper

## 2012-06-21 NOTE — Progress Notes (Signed)
Met with pt 1:1. Pt is anxious, depressed in affect and mood. Reports his mood has been up and down today along with on and off passive SI. Rates his mood at a "4-5" with 10 being the least depressed. Eye contact is brief, interaction minimal. Supported and encouraged. Medicated per orders without difficulty. Pt verbally contracts for safety. Denies HI/AVH at present and remains safe. No pain or physical problems. Lawrence Marseilles

## 2012-06-21 NOTE — Progress Notes (Signed)
Psychoeducational Group Note  Date:  06/21/2012 Time:  1015  Group Topic/Focus:  Making Healthy Choices:   The focus of this group is to help patients identify negative/unhealthy choices they were using prior to admission and identify positive/healthier coping strategies to replace them upon discharge.  Participation Level:  Active  Participation Quality:  Appropriate  Affect:  Appropriate  Cognitive:  Alert  Insight:  Improving  Engagement in Group:  Improving  Additional Comments:    Dione Housekeeper
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
## 2012-06-22 MED ORDER — MAGNESIUM CITRATE PO SOLN
1.0000 | Freq: Once | ORAL | Status: AC
  Administered 2012-06-22: 1 via ORAL

## 2012-06-22 MED ORDER — BISACODYL 10 MG RE SUPP
10.0000 mg | Freq: Once | RECTAL | Status: AC
  Administered 2012-06-22: 10 mg via RECTAL
  Filled 2012-06-22 (×2): qty 1

## 2012-06-22 MED ORDER — LORAZEPAM 1 MG PO TABS
ORAL_TABLET | ORAL | Status: AC
  Filled 2012-06-22: qty 1

## 2012-06-22 MED ORDER — LORAZEPAM 1 MG PO TABS
1.0000 mg | ORAL_TABLET | Freq: Once | ORAL | Status: AC
  Administered 2012-06-22: 1 mg via ORAL

## 2012-06-22 MED ORDER — SERTRALINE HCL 50 MG PO TABS
50.0000 mg | ORAL_TABLET | Freq: Every day | ORAL | Status: DC
  Administered 2012-06-23: 50 mg via ORAL
  Filled 2012-06-22 (×3): qty 1

## 2012-06-22 MED ORDER — LACTULOSE 10 GM/15ML PO SOLN
30.0000 g | Freq: Once | ORAL | Status: DC
  Filled 2012-06-22 (×3): qty 45

## 2012-06-22 NOTE — Progress Notes (Signed)
[facility] LCSW Group Therapy  06/22/2012 3:06 PM  Type of Therapy:  Group Therapy  Participation Level:  Minimal  Participation Quality:  Appropriate  Affect:  Depressed and Flat  Cognitive:  Appropriate  Insight:  Limited  Engagement in Therapy:  Limited  Modes of Intervention:  Discussion, Education, Exploration, Orientation, Problem-solving and Support  Summary of Progress/Problems:  Patient shared the obstacle he needs to overcome is back choices he has made in life,.  He shared he deals with a lot of fear but does not understand the reason behind his fedar.  Wynn Banker 06/22/2012, 3:06 PM
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
## 2012-06-22 NOTE — Progress Notes (Signed)
Extensive time spent with pt 1:1. Pt continues to struggle with periods of feeling drained/lethargic with periods where he feels internal restlessness and an inability to sit still. He states that his prior suicidal feelings/actions came on suddenly without warning and this concerns him going forward. He feels medications are not where they should be yet and his anxiety at times is unbearable. Pt also admits to feelings of guilt for not seeking treatment earlier. Sounds as if he beats himself up at times. Role played with patient, gave support, reassurance and encouragement. Pt receptive but remains withdrawn, timid. He continues to endorse passive SI but verbally contracts for safety with this RN. Denies HI/AVH and remains safe. Lawrence Marseilles

## 2012-06-22 NOTE — Progress Notes (Signed)
D:  Patrick Bowen states the he is sleeping well and his appetite is improving.  His energy level is low and his ability to pay attention is improving.  He is rating depression and hopelessness at 5/10, but is rating anxiety much higher at 9.  He reports off/on suicidal ideation, but does contract for safety.  At 1030 patient started to complain of chest pain with light headedness and hot flashes.  His vital signs were stable and he was given Ativan 1 mg po for anxiety.  Talked with the patient at length about his anxiety, OCD and the coping mechanisms he uses to help him with anxiety.  He became calmer throughout the conversation and his chest pain began to improve.  He returned to group, stating that groups are helping him more that anything.  After lunch he appeared calm and relaxed and was communicating appropriately with staff and others. A:  Emotional support provided.  Medications administered as ordered.  Safety checks q 15 minutes. R:  Safety maintained on unit.

## 2012-06-22 NOTE — Progress Notes (Signed)
Psychoeducational Group Note  Date:  06/22/2012 Time:  1100  Group Topic/Focus:  Self Care:   The focus of this group is to help patients understand the importance of self-care in order to improve or restore emotional, physical, spiritual, interpersonal, and financial health.  Participation Level: Did Not Attend  Participation Quality:  Not Applicable  Affect:  Not Applicable  Cognitive:  Not Applicable  Insight:  Not Applicable  Engagement in Group: Not Applicable  Additional Comments:  Pt remained resting in bed during this group.   Sharyn Lull 06/22/2012, 1:36 PM

## 2012-06-22 NOTE — Progress Notes (Signed)
Patient ID: Patrick Bowen, male   DOB: 1976/01/04, 37 y.o.   MRN: [id number] D: Pt. Lying in bed but interacts with Clinical research associate. Pt. Reports depression at "5" of 10.  Pt. Reports groups somewhat helpful "just knowing I'm not the only one" A: Writer introduced self to client and provided emotional support by listening and encouraging client to verbalize concerns. Staff will monitor q66min for safety. Pt. Encouraged to attend group. R: Pt. Is safe on the unit. Pt. Did not attend group.

## 2012-06-23 DIAGNOSIS — F339 Major depressive disorder, recurrent, unspecified: Secondary | ICD-10-CM

## 2012-06-23 DIAGNOSIS — F411 Generalized anxiety disorder: Secondary | ICD-10-CM | POA: Diagnosis present

## 2012-06-23 MED ORDER — SERTRALINE HCL 100 MG PO TABS
100.0000 mg | ORAL_TABLET | Freq: Every day | ORAL | Status: DC
  Administered 2012-06-24 – 2012-06-26 (×3): 100 mg via ORAL
  Filled 2012-06-23 (×2): qty 1
  Filled 2012-06-23: qty 4
  Filled 2012-06-23 (×3): qty 1

## 2012-06-23 MED ORDER — RISPERIDONE 0.5 MG PO TABS
0.5000 mg | ORAL_TABLET | Freq: Two times a day (BID) | ORAL | Status: DC
  Administered 2012-06-23 – 2012-06-26 (×6): 0.5 mg via ORAL
  Filled 2012-06-23 (×5): qty 1
  Filled 2012-06-23: qty 8
  Filled 2012-06-23: qty 1
  Filled 2012-06-23: qty 8
  Filled 2012-06-23 (×4): qty 1

## 2012-06-23 MED ORDER — BUSPIRONE HCL 10 MG PO TABS
10.0000 mg | ORAL_TABLET | Freq: Four times a day (QID) | ORAL | Status: DC
  Filled 2012-06-23 (×3): qty 1

## 2012-06-23 NOTE — Progress Notes (Signed)
Patient ID: Patrick Bowen, male   DOB: 06-27-1975, 37 y.o.   MRN: [id number] Pt. C/o no BM with earlier meds (see MAR), stomach soft non distended. Writer obtained reported to S. Simon, PA orders given for dulcolax suppository and lactulose. Pt. Received dulcolax and had BM shortly there after.  Pt. Refused lactulose reported he didn't needed it.

## 2012-06-23 NOTE — Progress Notes (Signed)
Grief and Loss Group  Patients discussed significant losses and grief in there lives.  Patients explored ways to cope effectively with loss and associated grief emotions.    Pt was active in group and attentive to group members as they shared. Pt offered ideas about the difficulty of managing physical and emotional pain.  Pt seemed stuck in thought pattern of pain being unchangeable, and remained abstract in thought and speech.  Pt offered support to group members, and may benefit from concrete discussion of ways to heal emotional pain.  Alvia Grove Counseling Intern [employer]

## 2012-06-23 NOTE — Progress Notes (Signed)
D:  Patrick Bowen reports that he slept ok and that his appetite is improving.  His energy level is low and he rates depression at 6/10 .  This morning he reported only a few "fleeting" thoughts of suicide, however at midday he reported an increase in suicidal ideation.  Spoke to [name] 1:1 and he spoke of fear being the thing that increases his anxiety.  The anxiety becomes overwhelming and the thoughts of hurting himself are comforting.  He states that once he gets something on his mind it is difficult to "snap out of it". I encouraged him to make a list of short term and long term goals because he talked about the comfort he finds in making lists.  He did work on this during the day.  He does continue to have passive SI, but contracts for safety. A:  Emotional support provided.  Medications administered as ordered.  Safety checks q 15 minutes. R:  Safety maintained on unit.

## 2012-06-23 NOTE — Progress Notes (Signed)
Reviewed

## 2012-06-23 NOTE — Progress Notes (Signed)
[facility] LCSW Group Therapy      Feelings About Diagnosis 1:15 - 2:30 PM          06/23/2012 3:48 PM  Type of Therapy:  Group Therapy  Participation Level:  Minimal  Participation Quality:  Appropriate  Affect:  Depressed and Flat  Cognitive:  Appropriate  Insight:  Limited  Engagement in Therapy:  Limited  Modes of Intervention:  Discussion, Education, Exploration, Orientation, Problem-solving and Support  Summary of Progress/Problems:  Patient shared he is okay with his diagnosis.  He stated if other people are concerned it is their problem and not his.  He shared he has learned younger people (in their 20's-30's) seem to focus more on his diagnosis then other people. Wynn Banker 06/23/2012, 3:48 PM
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
## 2012-06-23 NOTE — Progress Notes (Signed)
Psychoeducational Group Note  Date:  06/23/2012 Time:  1100  Group Topic/Focus:  Recovery Goals:   The focus of this group is to identify appropriate goals for recovery and establish a plan to achieve them.  Participation Level: Did Not Attend  Participation Quality:  Not Applicable  Affect:  Not Applicable  Cognitive:  Not Applicable  Insight:  Not Applicable  Engagement in Group: Not Applicable  Additional Comments:  Pt did not attend majority of group. Pt came to group the last 10 min of group and had limited interactions about recovery.   Sharyn Lull 06/23/2012, 1:36 PM

## 2012-06-23 NOTE — Progress Notes (Signed)
[facility] LCSW Aftercare Discharge Planning Group Note  06/23/2012 12:11 PM  Participation Quality:  Appropriate and Attentive  Affect:  Appropriate and Depressed  Cognitive:  Appropriate  Insight:  Engaged  Engagement in Group:  Engaged  Modes of Intervention:  Education, Exploration, Dentist, Rapport Building and Support  Summary of Progress/Problems:  Patient advised of having a decent morning.  He denied SI today but stated he had off/on SI yesterday.  He rates depression at seven/eight, and anxiety at eight.  Patient asked about support groups in the area.  He was told about [facility].  Patient shared he will be willing to drive to [facility] to get the help he needs.  Wynn Banker 06/23/2012, 12:11 PM

## 2012-06-24 NOTE — Progress Notes (Signed)
[facility] LCSW Group Therapy      Emotional Regulation 1:15 - 2:30 PM          06/24/2012 2:33 PM  Type of Therapy:  Group Therapy  Participation Level:  Minimal  Participation Quality:  Appropriate  Affect:  Depressed and Flat  Cognitive:  Appropriate  Insight:  Limited  Engagement in Therapy:  Limited  Modes of Intervention:  Discussion, Education, Exploration, Orientation, Problem-solving and Support  Summary of Progress/Problems:  Patient left group due to complaints of having a panic attack.  He reentered group toward the end of the session.  Patrick Bowen 06/24/2012, 2:33 PM

## 2012-06-24 NOTE — Progress Notes (Signed)
Recreation Therapy Notes  Date: 03.05.2014 Time: 3:00pm Location: [address]      Group Topic/Focus: Communication  Participation Level: Active  Participation Quality: Appropriate  Affect: Euthymic  Cognitive: Appropriate   Additional Comments: Patient selected an item out of bag to describe to peers for peers to guess. Patient used good descriptors. Patient contributed to group discussion on benefits of having good communication skills. Patient spoke about having trouble finding a support system outside of the hospital. Patient stated that he feels like people who don't have depression don't understand what he is going through and they give him useless advice. Patient stated finding a support system outside of the hospital would be very helpful to him. LRT encouraged patient to talk to LCSW about aftercare services and community support groups.   Marykay Lex Blanchfield, LRT/CTRS  Jearl Klinefelter 06/24/2012 4:33 PM

## 2012-06-24 NOTE — Progress Notes (Signed)
Recreation Therapy Notes  Date: 03.05.2014  Time: 2:55pm  Location: [address]   Group Topic/Focus: Animal Assist Activities/Therapy (AAA/T)   Participation Level:  Active   Participation Quality:  Appropriate   Affect:  Euthymic   Cognitive:  Appropriate   Additional Comments: Patient pet and visited with Summer, the dog. Patient fed summer a treat. Patient interacted appropriately with LRT, peers and dog team.   Denise L Blanchfield, LRT/CTRS     Blanchfield, Denise L 06/24/2012 4:17 PM 

## 2012-06-24 NOTE — Progress Notes (Signed)
D: Patient denies HI and A/V hallucinations and on and off thoughts of SI but contracts for safety; patient reports sleep is well; reports appetite to be improving ; reports energy level is low ; reports ability to pay attention to is improving; rates depression as 5 or 6/10; rates hopelessness 5/10; rates anxiety as 7/10;   A: Monitored q 15 minutes; patient encouraged to attend groups; patient educated about medications; patient given medications per physician orders; patient encouraged to express feelings and/or concerns  R: Patient is flat but cooperative and is very sad; patient's interaction with staff and peers is appropriate; patient was able to set goal to talk with staff 1:1 when having feelings of SI; patient is taking medications as prescribed and tolerating medications; patient is attending all groups

## 2012-06-24 NOTE — Progress Notes (Signed)
[facility] LCSW Aftercare Discharge Planning Group Note  06/24/2012 12:55 PM  Participation Quality:  Appropriate and Attentive  Affect:  Appropriate and Depressed  Cognitive:  Appropriate  Insight:  Engaged  Engagement in Group:  Engaged  Modes of Intervention:  Education, Exploration, Dentist, Rapport Building and Support  Summary of Progress/Problems:  Patient reports being okay today.  He denies SI/HI and rates depression and anxiety at seven.  Patient shared he is not having SI today and does not understand where the SI is coming from.   Wynn Banker 06/24/2012, 12:55 PM

## 2012-06-24 NOTE — Tx Team (Signed)
Interdisciplinary Treatment Plan Update   Date Reviewed:  06/24/2012  Time Reviewed:  10:00 AM  Progress in Treatment:   Attending groups: Yes Participating in groups: Yes Taking medication as prescribed: Yes  Tolerating medication: Yes Family/Significant other contact made: Contact to be made with family. Patient understands diagnosis: Yes  Discussing patient identified problems/goals with staff: Yes Medical problems stabilized or resolved: Yes Denies suicidal/homicidal ideation: Yes Patient has not harmed self or others: Yes  For review of initial/current patient goals, please see plan of care.  Estimated Length of Stay:  2-3 days  Reasons for Continued Hospitalization:  Anxiety Depression Medication stabilization  New Problems/Goals identified:    Discharge Plan or Barriers:   Home with outpatient follow up at [facility]  Additional Comments:  Patient is currently denying SI/HI.  He rates depression and anxiety at seven. He reports having severe panic attacks.  Patient started on Resperidal 0.5 mga nd Zoloft 100 mg.    Attendees:  Patient: Patrick Bowen 06/24/2012 10:00 AM   Signature:  06/24/2012 10:00 AM  Signature:Tina Arlana Pouch, RN 06/24/2012 10:00 AM  Signature:  06/24/2012 10:00 AM  Signature: Newman Nip, RN 06/24/2012 10:00 AM  Signature:   06/24/2012 10:00 AM  Signature:  Juline Patch, LCSW 06/24/2012 10:00 AM  Signature: Silverio Decamp, PMH-NP 06/24/2012 10:00 AM  Signature:  06/24/2012 10:00 AM  Signature:    Signature:    Signature:    Signature:      Scribe for Treatment Team:   Juline Patch,  06/24/2012 10:00 AM

## 2012-06-24 NOTE — Progress Notes (Signed)
Adult Psychoeducational Group Note  Date:  06/24/2012 Time:  1:33 PM  Group Topic/Focus:  Personal Choices and Values:   The focus of this group is to help patients assess and explore the importance of values in their lives, how their values affect their decisions, how they express their values and what opposes their expression.  Participation Level:  Active  Participation Quality:  Appropriate, Attentive and Sharing  Affect:  Appropriate  Cognitive:  Appropriate  Insight: Appropriate  Engagement in Group:  Engaged  Modes of Intervention:  Discussion, Education and Support  Additional Comments:  [name] attended group and participated. Patient shared personal term of values and choices. Patient explained the negative and positive choices and values that have been made throughout lifespan. Patient then discussed what the outcome of the negative choices effect on patient life. Patient completed form in group on Identifying values and  Choosing a value orientating life worksheet, and explained answers within the group.    Patrick Bowen 06/24/2012, 1:33 PM

## 2012-06-24 NOTE — Progress Notes (Signed)
Resting quietly with eyes closed. Respirations even and unlabored. No distress noted, Q 15 minute check continues as ordered to maintain safety. 

## 2012-06-24 NOTE — Progress Notes (Signed)
Patient ID: Patrick Bowen, male   DOB: [birth year], 36 y.o.   MRN: [id number] D: pt. Visible on milieu in day room watching TV, interacting with other clients. Pt. Depression at 4 of "10" and anxiety "5" of 10. Pt. Reports depression is like an elevator up and down. A: Writer provided positive support my listening. Writer encouraged group. Staff will monitor q68min for safety. R: Pt. Is safe on the unit. Pt. Attended group.

## 2012-06-24 NOTE — Progress Notes (Signed)
[facility] INPATIENT:  Family/Significant Other Suicide Prevention Education  Suicide Prevention Education:  Education Completed:  Magdaleno Lortie, Father, [phone] has been identified by the patient as the family member/significant other with whom the patient will be residing, and identified as the person(s) who will aid the patient in the event of a mental health crisis (suicidal ideations/suicide attempt).  With written consent from the patient, the family member/significant other has been provided the following suicide prevention education, prior to the and/or following the discharge of the patient.  The suicide prevention education provided includes the following:  Suicide risk factors  Suicide prevention and interventions  National Suicide Hotline telephone number  [facility] assessment telephone number  [facility] Emergency Assistance 911  [facility] and/or Residential Mobile Crisis Unit telephone number  Request made of family/significant other to:  Remove weapons (e.g., guns, rifles, knives), all items previously/currently identified as safety concern.  Father reports patient does not have access to a gun.  Remove drugs/medications (over-the-counter, prescriptions, illicit drugs), all items previously/currently identified as a safety concern.  The family member/significant other verbalizes understanding of the suicide prevention education information provided.  The family member/significant other agrees to remove the items of safety concern listed above.  Wynn Banker 06/24/2012, 4:10 PM

## 2012-06-24 NOTE — Progress Notes (Signed)
[facility] MD Progress Note  06/24/2012 12:11 PM Patrick Bowen  MRN:  [id number] Subjective:  6-7/10 depression, anxiety "still high, I'm having a bad panic attack right now."  No behavioral signs or symptoms of a panic attack noted.  Suicidal ideation--"mostly just racing thoughts, no conscious thing I think about."  He also stated he feels his short term is poor, "foggy."  Appetite is "slowly improving, feel like eating at meals" versus just forcing himself to eat.  He also states his sleep has improved, initiation of sleep is good and when awakens during the night,he can go back to sleep faster--feels "refreshed."  Diagnosis:   Axis I: Anxiety Disorder NOS, Major Depression, Recurrent severe and Post Traumatic Stress Disorder Axis II: Deferred Axis III:  Past Medical History  Diagnosis Date  . Hypertension   . Depression   . Anxiety    Axis IV: occupational problems, other psychosocial or environmental problems, problems related to social environment and problems with primary support group Axis V: 41-50 serious symptoms  ADL's:  Intact  Sleep: Good  Appetite:  Fair  Suicidal Ideation:  Passive thoughts at time Homicidal Ideation:  Denies  Psychiatric Specialty Exam: Review of Systems  Constitutional: Negative.   HENT: Negative.   Eyes: Negative.   Respiratory: Negative.   Cardiovascular: Negative.   Gastrointestinal: Negative.   Genitourinary: Negative.   Musculoskeletal: Negative.   Skin: Negative.   Neurological: Negative.   Endo/Heme/Allergies: Negative.   Psychiatric/Behavioral: Positive for depression. The patient is nervous/anxious.     Blood pressure 143/79, pulse 96, temperature 97.4 F (36.3 C), temperature source Oral, resp. rate 18, height 6' 3.5" (1.918 m), weight 78.926 kg (174 lb).Body mass index is 21.45 kg/(m^2).  General Appearance: Casual  Eye Contact::  Fair  Speech:  Normal Rate  Volume:  Normal  Mood:  Anxious and Depressed  Affect:  Flat  Thought  Process:  Coherent  Orientation:  Full (Time, Place, and Person)  Thought Content:  WDL  Suicidal Thoughts:  Yes.  without intent/plan  Homicidal Thoughts:  No  Memory:  Immediate;   Fair Recent;   Fair Remote;   Fair  Judgement:  Fair  Insight:  Fair  Psychomotor Activity:  Increased  Concentration:  Fair  Recall:  Fair  Akathisia:  No  Handed:  Right  AIMS (if indicated):     Assets:  Housing Physical Health Resilience Social Support  Sleep:  Number of Hours: 6   Current Medications: Current Facility-Administered Medications  Medication Dose Route Frequency Heriberto Stmartin Last Rate Last Dose  . acetaminophen (TYLENOL) tablet 650 mg  650 mg Oral Q6H PRN Nanine Means, NP   650 mg at 06/24/12 0756  . alum & mag hydroxide-simeth (MAALOX/MYLANTA) 200-200-20 MG/5ML suspension 30 mL  30 mL Oral Q4H PRN Nanine Means, NP      . feeding supplement (ENSURE COMPLETE) liquid 237 mL  237 mL Oral BID BM Jeoffrey Massed, RD   237 mL at 06/20/12 1400  . hydrochlorothiazide (HYDRODIURIL) tablet 25 mg  25 mg Oral Daily Nanine Means, NP   25 mg at 06/24/12 0756  . hydrOXYzine (ATARAX/VISTARIL) tablet 50 mg  50 mg Oral TID AC & HS Cleotis Nipper, MD   50 mg at 06/24/12 1131  . lactase (LACTAID) tablet 6,000 Units  6,000 Units Oral TID WC Mickeal Skinner, MD   6,000 Units at 06/23/12 0650  . lactulose (CHRONULAC) 10 GM/15ML solution 30 g  30 g Oral Once Kerry Hough,  PA-C      . lisinopril (PRINIVIL,ZESTRIL) tablet 10 mg  10 mg Oral Daily Nanine Means, NP   10 mg at 06/24/12 0756  . magnesium hydroxide (MILK OF MAGNESIA) suspension 30 mL  30 mL Oral Daily PRN Nanine Means, NP   30 mL at 06/22/12 1610  . potassium chloride (K-DUR,KLOR-CON) CR tablet 10 mEq  10 mEq Oral BID Nanine Means, NP   10 mEq at 06/24/12 0745  . risperiDONE (RISPERDAL) tablet 0.5 mg  0.5 mg Oral BID Nanine Means, NP   0.5 mg at 06/24/12 0746  . sertraline (ZOLOFT) tablet 100 mg  100 mg Oral Daily Nanine Means, NP   100 mg at 06/24/12  0746  . traZODone (DESYREL) tablet 50 mg  50 mg Oral QHS,MR X 1 Mickeal Skinner, MD   50 mg at 06/23/12 2212    Lab Results: No results found for this or any previous visit (from the past 48 hour(s)).  Physical Findings: AIMS: Facial and Oral Movements Muscles of Facial Expression: None, normal Lips and Perioral Area: None, normal Jaw: None, normal Tongue: None, normal,Extremity Movements Upper (arms, wrists, hands, fingers): None, normal Lower (legs, knees, ankles, toes): None, normal, Trunk Movements Neck, shoulders, hips: None, normal, Overall Severity Severity of abnormal movements (highest score from questions above): None, normal Incapacitation due to abnormal movements: None, normal Patient's awareness of abnormal movements (rate only patient's report): No Awareness, Dental Status Current problems with teeth and/or dentures?: No Does patient usually wear dentures?: No  CIWA:    COWS:     Treatment Plan Summary: Daily contact with patient to assess and evaluate symptoms and progress in treatment Medication management  Plan: Plan:  Review of chart, vital signs, medications, and notes. 1-Individual and group therapy 2-Medication management for depression and anxiety:  Medications reviewed with the patient and he stated no adverse effects, no changes made 3-Coping skills for depression and anxiety:  Alternate nare breathing and sitting down with head down technique taught to the patient to assist his anxiety/panic attacks,  5 senses also encouraged--taught over the week-end 4-Continue crisis stabilization and management 5-Address health issues--monitoring blood pressures, stable 6-Treatment plan in progress to prevent relapse of depression and anxiety  Medical Decision Making Problem Points:  Established problem, stable/improving (1) and Review of psycho-social stressors (1) Data Points:  Review of medication regiment & side effects (2)  I certify that inpatient services  furnished can reasonably be expected to improve the patient's condition.   Nanine Means, PMH-NP 06/24/2012, 12:11 PM

## 2012-06-25 ENCOUNTER — Ambulatory Visit (HOSPITAL_COMMUNITY): Payer: Self-pay | Admitting: Psychiatry

## 2012-06-25 NOTE — Progress Notes (Signed)
Patient ID: Patrick Bowen, male   DOB: [birth year], 37 y.o.   MRN: [id number] D: Pt. Lying in bed reports "I really had a good day, I laughed at jokes today" "I'm wore out just want ot rest" A: Writer encouraged pt. To attend karaoke. Staff will monitor q45min for safety. R: Pt. Is safe on the unit. Pt. Did not attend karaoke.

## 2012-06-25 NOTE — Progress Notes (Signed)
D:  Patient up and active in the milieu today.  Has attended and participated in groups.  Affect remains flat.  Rates his depression and hopelessness both at a 4-5 today and admits to having fleeting suicidal thoughts.  States his mind races much of the time.  Agrees to seek out staff if feeling unsafe.  States he would not act on the thoughts.  States appetite is improved and has actually declined his scheduled Ensure today.  A:  Medications as scheduled.  Safety checks maintained every 15 minutes.  Encouraged participation in all groups.  R:  Pleasant and cooperative.  Interacting well with staff and peers.  Participating in groups.  Safety on the unit is maintained.

## 2012-06-25 NOTE — Progress Notes (Signed)
Patient ID: Patrick Bowen, male   DOB: 1976-02-19, 37 y.o.   MRN: [id number] [facility] MD Progress Note  06/25/2012 1:24 PM Patrick Bowen  MRN:  [id number]  Subjective: patient was seen and assessed in his room today, He reports, "I was feeling some how better today I went to lunch and found a bug in my salad. It freaked me out, now my anxiety is at #6. Prior to this, I was actually in a good mood. Today is the first day that I have laughed and even told some jokes in a long time. I came here under involuntary commitment because I attempted to take my own life. I have been feeling as such for a while because I was depressed. I was felt hopeless. My anxiety was uncontrollable. Little things happening will just set me off. I still unconciously think about suicide, king of a fleecing thoughts. I'm learning to practice effective breathing techniques".  Diagnosis:   Axis I: Anxiety Disorder NOS, Major Depression, Recurrent severe and Post Traumatic Stress Disorder Axis II: Deferred Axis III:  Past Medical History  Diagnosis Date  . Hypertension   . Depression   . Anxiety    Axis IV: occupational problems, other psychosocial or environmental problems, problems related to social environment and problems with primary support group Axis V: 41-50 serious symptoms  ADL's:  Intact  Sleep: Good, 6.5 per documentation.  Appetite:  Fair  Suicidal Ideation: "yes, fleecing thoughts" Denies plan, intent and means.  Homicidal Ideation:  Denies  Psychiatric Specialty Exam: Review of Systems  Constitutional: Negative.   HENT: Negative.   Eyes: Negative.   Respiratory: Negative.   Cardiovascular: Negative.   Gastrointestinal: Negative.   Genitourinary: Negative.   Musculoskeletal: Negative.   Skin: Negative.   Neurological: Negative.   Endo/Heme/Allergies: Negative.   Psychiatric/Behavioral: Positive for depression. The patient is nervous/anxious.     Blood pressure 142/94, pulse 109, temperature  97.7 F (36.5 C), temperature source Oral, resp. rate 18, height 6' 3.5" (1.918 m), weight 78.926 kg (174 lb).Body mass index is 21.45 kg/(m^2).  General Appearance: Casual  Eye Contact::  Fair  Speech:  Normal Rate  Volume:  Normal  Mood:  Anxious and Depressed  Affect:  Flat  Thought Process:  Coherent  Orientation:  Full (Time, Place, and Person)  Thought Content:  WDL  Suicidal Thoughts:  Fleecing thoughts.  Homicidal Thoughts:  No  Memory:  Immediate;   Fair Recent;   Fair Remote;   Fair  Judgement:  Fair  Insight:  Fair  Psychomotor Activity:  Anxious  Concentration:  Fair  Recall:  Fair  Akathisia:  No  Handed:  Right  AIMS (if indicated):     Assets:  Housing Physical Health Resilience Social Support  Sleep:  Number of Hours: 6.5   Current Medications: Current Facility-Administered Medications  Medication Dose Route Frequency Provider Last Rate Last Dose  . acetaminophen (TYLENOL) tablet 650 mg  650 mg Oral Q6H PRN Nanine Means, NP   650 mg at 06/24/12 0756  . alum & mag hydroxide-simeth (MAALOX/MYLANTA) 200-200-20 MG/5ML suspension 30 mL  30 mL Oral Q4H PRN Nanine Means, NP      . feeding supplement (ENSURE COMPLETE) liquid 237 mL  237 mL Oral BID BM Jeoffrey Massed, RD   237 mL at 06/20/12 1400  . hydrochlorothiazide (HYDRODIURIL) tablet 25 mg  25 mg Oral Daily Nanine Means, NP   25 mg at 06/25/12 0745  . hydrOXYzine (ATARAX/VISTARIL) tablet 50 mg  50 mg Oral TID AC & HS Cleotis Nipper, MD   50 mg at 06/25/12 1159  . lactase (LACTAID) tablet 6,000 Units  6,000 Units Oral TID WC Mickeal Skinner, MD   6,000 Units at 06/23/12 0650  . lactulose (CHRONULAC) 10 GM/15ML solution 30 g  30 g Oral Once [employer], PA-C      . lisinopril (PRINIVIL,ZESTRIL) tablet 10 mg  10 mg Oral Daily Nanine Means, NP   10 mg at 06/25/12 0745  . magnesium hydroxide (MILK OF MAGNESIA) suspension 30 mL  30 mL Oral Daily PRN Nanine Means, NP   30 mL at 06/22/12 8657  . potassium chloride  (K-DUR,KLOR-CON) CR tablet 10 mEq  10 mEq Oral BID Nanine Means, NP   10 mEq at 06/25/12 0745  . risperiDONE (RISPERDAL) tablet 0.5 mg  0.5 mg Oral BID Nanine Means, NP   0.5 mg at 06/25/12 0745  . sertraline (ZOLOFT) tablet 100 mg  100 mg Oral Daily Nanine Means, NP   100 mg at 06/25/12 0745  . traZODone (DESYREL) tablet 50 mg  50 mg Oral QHS,MR X 1 Mickeal Skinner, MD   50 mg at 06/24/12 2242    Lab Results: No results found for this or any previous visit (from the past 48 hour(s)).  Physical Findings: AIMS: Facial and Oral Movements Muscles of Facial Expression: None, normal Lips and Perioral Area: None, normal Jaw: None, normal Tongue: None, normal,Extremity Movements Upper (arms, wrists, hands, fingers): None, normal Lower (legs, knees, ankles, toes): None, normal, Trunk Movements Neck, shoulders, hips: None, normal, Overall Severity Severity of abnormal movements (highest score from questions above): None, normal Incapacitation due to abnormal movements: None, normal Patient's awareness of abnormal movements (rate only patient's report): No Awareness, Dental Status Current problems with teeth and/or dentures?: No Does patient usually wear dentures?: No  CIWA:    COWS:     Treatment Plan Summary: Daily contact with patient to assess and evaluate symptoms and progress in treatment Medication management  Plan:Plan:  Review of chart, vital signs, medications, and notes. 1-Individual and group therapy session. 2-Medication management for depression and anxiety. 3-Encouraged patient to to be out of bed daily, attend and participate in group sessions.Marland Kitchen 4-Continue crisis stabilization and management 5-Address health issues. 6- Continue currentTreatment plan.  Medical Decision Making Problem Points:  Established problem, stable/improving (1) and Review of psycho-social stressors (1) Data Points:  Review of medication regiment & side effects (2)  I certify that inpatient services  furnished can reasonably be expected to improve the patient's condition.   Armandina Stammer I, PMH-NP 06/25/2012, 1:24 PM

## 2012-06-25 NOTE — Progress Notes (Signed)
[facility] LCSW Group Therapy    [facility] 1:15 - 2:30 PM           06/25/2012 3:20 PM  Type of Therapy:  Group Therapy  Participation Level:  Minimal  Participation Quality:  Appropriate  Affect:  Depressed and Flat  Cognitive:  Appropriate  Insight:  Limited  Engagement in Therapy:  Limited  Modes of Intervention:  Discussion, Education, Exploration, Orientation, Problem-solving and Support  Summary of Progress/Problems: Patient listened attentively to speaker from [employer] but made no comments on the presentation.       Wynn Banker 06/25/2012, 3:20 PM

## 2012-06-25 NOTE — Progress Notes (Signed)
Reviewed

## 2012-06-25 NOTE — Progress Notes (Signed)
[facility] LCSW Aftercare Discharge Planning Group Note  06/25/2012 12:32 PM  Participation Quality:  Appropriate and Attentive  Affect:  Appropriate  Cognitive:  Alert and Appropriate  Insight:  Engaged  Engagement in Group:  Engaged  Modes of Intervention:  Education, Exploration, Problem-solving, Rapport Building and Support  Summary of Progress/Problems:  Patient reports doing much better today.  He endorses SI but no plan or intent.  Patient is able to contract for safety. He rates depression at four/five and anxiety at six/ Seven.  Patient is hopeful to discharge home tomorrow.  Daily workbook provided.   Wynn Banker 06/25/2012, 12:32 PM

## 2012-06-25 NOTE — Progress Notes (Signed)
[facility] Group Notes:  (Nursing/MHT/Case Management/Adjunct)  Date:  06/25/2012  Time:  12:50 AM  Type of Therapy:  Group Therapy  Participation Level:  Active  Participation Quality:  Appropriate  Affect:  Appropriate  Cognitive:  Appropriate  Insight:  Appropriate  Engagement in Group:  Engaged  Modes of Intervention:  Socialization and Support  Summary of Progress/Problems: Pt. Stated staying healthy mentally and physically were important to him when he is discharged.  Sondra Come 06/25/2012, 12:50 AM

## 2012-06-25 NOTE — Progress Notes (Signed)
Adult Psychoeducational Group Note  Date:  06/25/2012 Time:  3:16 PM  Group Topic/Focus:  Emotional Education:   The focus of this group is to discuss what feelings/emotions are, and how they are experienced.  Participation Level:  Active  Participation Quality:  Appropriate and Attentive  Affect:  Blunted and Flat  Cognitive:  Alert, Appropriate and Oriented  Insight: Good  Engagement in Group:  Developing/Improving and Engaged  Modes of Intervention:  Discussion  Additional Comments:  The topic or discussion was joy and how we define it.  We also talked about ways to incorporate joy and laughter into our daily lives.  We discussed the positive benefits of laughter and explored ways to invoke laughter.  Patient was attentive and shared his thoughts with the group.  He listened and was attentive as others were talking.   Izola Price Mae 06/25/2012, 3:16 PM

## 2012-06-26 MED ORDER — SERTRALINE HCL 100 MG PO TABS: 100.0000 mg | ORAL_TABLET | Freq: Every day | ORAL | Status: DC

## 2012-06-26 MED ORDER — RISPERIDONE 0.5 MG PO TABS: 0.5000 mg | ORAL_TABLET | Freq: Two times a day (BID) | ORAL | Status: DC

## 2012-06-26 MED ORDER — HYDROCHLOROTHIAZIDE 25 MG PO TABS: 25.0000 mg | ORAL_TABLET | Freq: Every day | ORAL | Status: DC

## 2012-06-26 MED ORDER — HYDROXYZINE HCL 50 MG PO TABS: 50.0000 mg | ORAL_TABLET | Freq: Three times a day (TID) | ORAL | Status: DC

## 2012-06-26 MED ORDER — TRAZODONE HCL 50 MG PO TABS: 50.0000 mg | ORAL_TABLET | Freq: Every evening | ORAL | Status: DC | PRN

## 2012-06-26 MED ORDER — LISINOPRIL 10 MG PO TABS: 10.0000 mg | ORAL_TABLET | Freq: Every day | ORAL | Status: DC

## 2012-06-26 NOTE — Progress Notes (Signed)
Patient ID: Patrick Bowen, male   DOB: Dec 21, 1975, 38 y.o.   MRN: [id number] Patient discharged per physician order; patient denies SI/HI and A/V hallucinations; patient received samples, prescriptions; and copy of AVS after it was reviewed; patient had no other questions or concerns at this time; patient verbalized and signed that he received all his belongings; patient left the unit ambulatory

## 2012-06-26 NOTE — Progress Notes (Signed)
[facility] LCSW Group Therapy         Feelings About Diagnosis 1:15 - 2:30 PM                     06/26/2012 3:41 PM  Type of Therapy:  Group Therapy  Participation Level:  Active  Participation Quality:  Appropriate  Affect:  Appropriate  Cognitive:  Appropriate  Insight:  Engaged  Engagement in Therapy:  Engaged  Modes of Intervention:  Discussion, Education, Exploration, Orientation, Problem-solving and Support  Summary of Progress/Problems: Patient reports relapse for him would be returning to negative thinking and sliding downhill.  He shared he has talked with his father about signs to look for that would indicated he is going back into his old patterns/behaviors.    Wynn Banker 06/26/2012, 3:41 PM

## 2012-06-26 NOTE — Progress Notes (Signed)
Pt declined to go to [place] group, stayed in his room.

## 2012-06-26 NOTE — [facility] Suicide Risk Assessment (Signed)
Suicide Risk Assessment  Discharge Assessment     Demographic Factors:  Male and Caucasian  Mental Status Per Nursing Assessment::   On Admission:  Suicide plan;Self-harm thoughts  Current Mental Status by Physician: Patient alert and oriented to 4. Patient denies AH/VH/SI/HI.  Loss Factors: NA  Historical Factors: Impulsivity  Risk Reduction Factors:   Sense of responsibility to family, Positive social support and Positive coping skills or problem solving skills  Continued Clinical Symptoms:  Depression:   Recent sense of peace/wellbeing  Cognitive Features That Contribute To Risk:  Cognitively intact  Suicide Risk:  Minimal: No identifiable suicidal ideation.  Patients presenting with no risk factors but with morbid ruminations; may be classified as minimal risk based on the severity of the depressive symptoms  Discharge Diagnoses:   AXIS I:  Major Depression, Recurrent severe AXIS II:  No diagnosis AXIS III:   Past Medical History  Diagnosis Date  . Hypertension   . Depression   . Anxiety    AXIS IV:  other psychosocial or environmental problems AXIS V:  61-70 mild symptoms  Plan Of Care/Follow-up recommendations:  Activity:  as tolerated Diet:  regular Follow up appointments will be called in by CM Octaviano Glow on Monday. Is patient on multiple antipsychotic therapies at discharge:  No   Has Patient had three or more failed trials of antipsychotic monotherapy by history:  No  Recommended Plan for Multiple Antipsychotic Therapies: NA  RAVI, HIMABINDU 06/26/2012, 11:59 AM
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
## 2012-06-29 NOTE — Progress Notes (Signed)
Reviewed

## 2012-07-01 ENCOUNTER — Ambulatory Visit (INDEPENDENT_AMBULATORY_CARE_PROVIDER_SITE_OTHER): Payer: BC Managed Care – PPO | Admitting: Psychiatry

## 2012-07-01 ENCOUNTER — Encounter (HOSPITAL_COMMUNITY): Payer: Self-pay | Admitting: Psychiatry

## 2012-07-01 DIAGNOSIS — F331 Major depressive disorder, recurrent, moderate: Secondary | ICD-10-CM

## 2012-07-01 DIAGNOSIS — F411 Generalized anxiety disorder: Secondary | ICD-10-CM

## 2012-07-01 NOTE — Progress Notes (Signed)
Patient Discharge Instructions:  Next Level Care Provider Has Access to the EMR, 07/01/12 Records provided to [facility] via [facility]/Epic access  Jerelene Redden, 07/01/2012, 2:35 PM

## 2012-07-01 NOTE — Patient Instructions (Addendum)
Suicidal Feelings, How to Help Yourself Everyone feels sad or unhappy at times, but depressing thoughts and feelings of hopelessness can lead to thoughts of suicide. It can seem as if life is too tough to handle. If you feel as though you have reached the point where suicide is the only answer, it is time to let someone know immediately.  HOW TO COPE AND PREVENT SUICIDE  Let family, friends, teachers, or counselors know. Get help. Try not to isolate yourself from those who care about you. Even though you may not feel sociable, talk with someone every day. It is best if it is face-to-face. Remember, they will want to help you.  Eat a regularly spaced and well-balanced diet.  Get plenty of rest.  Avoid alcohol and drugs because they will only make you feel worse and may also lower your inhibitions. Remove them from the home. If you are thinking of taking an overdose of your prescribed medicines, give your medicines to someone who can give them to you one day at a time. If you are on antidepressants, let your caregiver know of your feelings so he or she can provide a safer medicine, if that is a concern.  Remove weapons or poisons from your home.  Try to stick to routines. Follow a schedule and remind yourself that you have to keep that schedule every day.  Set some realistic goals and achieve them. Make a list and cross things off as you go. Accomplishments give a sense of worth. Wait until you are feeling better before doing things you find difficult or unpleasant to do.  If you are able, try to start exercising. Even half-hour periods of exercise each day will make you feel better. Getting out in the sun or into nature helps you recover from depression faster. If you have a favorite place to walk, take advantage of that.  Increase safe activities that have always given you pleasure. This may include playing your favorite music, reading a good book, painting a picture, or playing your favorite  instrument. Do whatever takes your mind off your depression.  Keep your living space well-lighted. GET HELP Contact a suicide hotline, crisis center, or local suicide prevention center for help right away. Local centers may include a hospital, clinic, community service organization, social service Patrick Bowen, or health department.  Call your local emergency services (911 in the [place]).  Call a suicide hotline:  1-800-273-TALK (346-032-2469) in the [place].  1-800-SUICIDE 250-017-4258) in the [place].  (681)851-0390 in the [place] for Spanish-speaking counselors.  4-132-440-1UUV 2608880892) in the [place] for TTY users.  Visit the following websites for information and help:  National Suicide Prevention Lifeline: www.suicidepreventionlifeline.org  Hopeline: www.hopeline.com  [employer] for Suicide Prevention: https://www.ayers.com/  For lesbian, gay, bisexual, transgender, or questioning youth, contact [employer]:  4-259-5-G-LOVFIE 314-036-2359) in the [place].  www.thetrevorproject.org  In [place], treatment resources are listed in each province with listings available under [employer] for [employer] or similar titles. Another source for Crisis Centres by [place] is located at http://www.suicideprevention.ca/in-crisis-now/find-a-crisis-centre-now/crisis-centres Document Released: 10/13/2002 Document Revised: 07/01/2011 Document Reviewed: 03/03/2007 [facility] Patient Information 2013 [place], [place]. Helping Someone Who Is Suicidal Take threats of suicide seriously. Listen to a suicidal person's thoughts and concerns with compassion. The fact that the person is talking to you is an important sign that he or she trusts you. Reasons for suicide can depend on where we are in life.   The younger person is often  depressed over lost love.  The middle-aged person is often depressed over financial problems.  The elderly  person is often depressed over health problems. SIGNS IN FAMILY OR FRIENDS WHO ARE SUICIDAL INCLUDE:  Depression which suddenly gets better. Getting over depression is usually a gradual process. A sudden change may mean the person has suddenly thought of suicide as a "solution."  A sudden loss of interest in family and friends and social withdrawal.  Loss of personal hygiene habits and not caring for himself or herself.  Decline in handling of school, work, or other activities.  Injuries which are self-inflicted, such as burning or cutting.  Expressions of helplessness, hopelessness, and a sense of the loss of ability to handle life.  Risk-taking behavior, such as casual sex and drug use. COMMON SUICIDE RISKS INCLUDE:  Death or terminal illness of a relative or friend.  Broken relationships.  Loss of health.  Financial losses.  Chemical abuse (drugs and alcohol).  Previous suicide attempts. If you do not feel adequate to listen or help, ask the person if you can help him or her get help. Ask if you can share the person's concerns with someone else such as a Pharmacist, hospital. Just talking with someone else is helpful and you can be that help by listening. Some helpful tips are:  Listen to the person's thoughts and concerns. Let the person unburden his or her troubles on you.  Let the person know you will not let him or her be alone with the pain.  Ask the person if he or she is having thoughts of hurting himself or herself.  Ask what you can do to help lessen the pain.  Suggest that the person seek professional help and that you will assist him or her in finding help. Let the person know you will continue to be available to help. GET HELP  Contact a suicide hotline, crisis center, or local suicide prevention center for help right away. Local centers may include a hospital, clinic, community service organization, social service Patrick Bowen, or health department.  Call your  local emergency services (911 in the [place]).  Call a suicide hotline:  1-800-273-TALK ((250)320-4650) in the [place].  1-800-SUICIDE 586 407 2075) in the [place].  (516) 709-4677 in the [place] for Spanish-speaking counselors.  4-696-295-2WUX (445)783-9567) in the [place] for TTY users.  Visit the following websites for information and help:  National Suicide Prevention Lifeline: www.suicidepreventionlifeline.org  Hopeline: www.hopeline.com  [employer] for Suicide Prevention: https://www.ayers.com/  For lesbian, gay, bisexual, transgender, or questioning youth, contact [employer]:  3-664-4-I-HKVQQV 4794660925) in the [place].  www.thetrevorproject.org  In [place], treatment resources are listed in each province with listings available under [employer] for [employer] or similar titles. Another source for Crisis Centres by [place] is located at http://www.suicideprevention.ca/in-crisis-now/find-a-crisis-centre-now/crisis-centres Document Released: 10/13/2002 Document Revised: 07/01/2011 Document Reviewed: 12/15/2007 [facility] Patient Information 2013 [place], [place].

## 2012-07-01 NOTE — Discharge Summary (Signed)
Reviewed
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
## 2012-07-06 ENCOUNTER — Ambulatory Visit (INDEPENDENT_AMBULATORY_CARE_PROVIDER_SITE_OTHER): Payer: BC Managed Care – PPO | Admitting: Psychiatry

## 2012-07-06 DIAGNOSIS — F331 Major depressive disorder, recurrent, moderate: Secondary | ICD-10-CM

## 2012-07-06 DIAGNOSIS — F411 Generalized anxiety disorder: Secondary | ICD-10-CM

## 2012-07-06 NOTE — Patient Instructions (Signed)
Discussed orally 
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
## 2012-07-10 ENCOUNTER — Telehealth (HOSPITAL_COMMUNITY): Payer: Self-pay | Admitting: Psychiatry

## 2012-07-10 ENCOUNTER — Encounter (HOSPITAL_COMMUNITY): Payer: Self-pay | Admitting: Psychiatry

## 2012-07-10 ENCOUNTER — Ambulatory Visit (INDEPENDENT_AMBULATORY_CARE_PROVIDER_SITE_OTHER): Payer: BC Managed Care – PPO | Admitting: Psychiatry

## 2012-07-10 VITALS — Wt 183.6 lb

## 2012-07-10 DIAGNOSIS — F410 Panic disorder [episodic paroxysmal anxiety] without agoraphobia: Secondary | ICD-10-CM

## 2012-07-10 DIAGNOSIS — F331 Major depressive disorder, recurrent, moderate: Secondary | ICD-10-CM

## 2012-07-10 DIAGNOSIS — F515 Nightmare disorder: Secondary | ICD-10-CM

## 2012-07-10 DIAGNOSIS — F4310 Post-traumatic stress disorder, unspecified: Secondary | ICD-10-CM

## 2012-07-10 DIAGNOSIS — F411 Generalized anxiety disorder: Secondary | ICD-10-CM

## 2012-07-10 DIAGNOSIS — F1910 Other psychoactive substance abuse, uncomplicated: Secondary | ICD-10-CM

## 2012-07-10 DIAGNOSIS — F1994 Other psychoactive substance use, unspecified with psychoactive substance-induced mood disorder: Secondary | ICD-10-CM

## 2012-07-10 DIAGNOSIS — F332 Major depressive disorder, recurrent severe without psychotic features: Secondary | ICD-10-CM

## 2012-07-10 DIAGNOSIS — M549 Dorsalgia, unspecified: Secondary | ICD-10-CM

## 2012-07-10 MED ORDER — TRAZODONE HCL 100 MG PO TABS: 50.0000 mg | ORAL_TABLET | Freq: Every evening | ORAL | Status: DC | PRN

## 2012-07-10 MED ORDER — RISPERIDONE 0.5 MG PO TABS: 0.5000 mg | ORAL_TABLET | Freq: Two times a day (BID) | ORAL | Status: DC

## 2012-07-10 MED ORDER — METHOCARBAMOL 500 MG PO TABS: 500.0000 mg | ORAL_TABLET | Freq: Four times a day (QID) | ORAL | Status: DC

## 2012-07-10 MED ORDER — PRAZOSIN HCL 1 MG PO CAPS: ORAL_CAPSULE | ORAL | Status: DC

## 2012-07-10 MED ORDER — HYDROXYZINE HCL 50 MG PO TABS: 50.0000 mg | ORAL_TABLET | Freq: Three times a day (TID) | ORAL | Status: DC

## 2012-07-10 MED ORDER — SERTRALINE HCL 100 MG PO TABS: 100.0000 mg | ORAL_TABLET | Freq: Every day | ORAL | Status: DC
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
## 2012-07-10 NOTE — Patient Instructions (Addendum)
Get into a 12 step program.  Relaxation is the ultimate solution for you.  You can seek it through tub baths, bubble baths, essential oils or incense, walking or chatting with friends, listening to soft music, watching a candle burn and just letting all thoughts go and appreciating the true essence of the Creator.   Yoga is a very helpful exercise method.  On TV, on line, or by DVD Patrick Bowen is a source of high quality information about yoga and videos on yoga.  Patrick Bowen is the world's number one video yoga instructor according to some experts.  There are exceptional health benefits that can be achieved through yoga.  The main principles of yoga is acceptance, no competition, no comparison, and no judgement.  It is exceptional in helping people meditate and get to a very relaxed state.   Call if problems or concerns.

## 2012-07-16 ENCOUNTER — Ambulatory Visit (INDEPENDENT_AMBULATORY_CARE_PROVIDER_SITE_OTHER): Payer: BC Managed Care – PPO | Admitting: Psychiatry

## 2012-07-16 DIAGNOSIS — F331 Major depressive disorder, recurrent, moderate: Secondary | ICD-10-CM

## 2012-07-16 DIAGNOSIS — F411 Generalized anxiety disorder: Secondary | ICD-10-CM

## 2012-07-16 NOTE — Patient Instructions (Signed)
Discussed orally 
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
## 2012-07-27 ENCOUNTER — Telehealth (HOSPITAL_COMMUNITY): Payer: Self-pay | Admitting: Psychiatry

## 2012-07-27 DIAGNOSIS — I10 Essential (primary) hypertension: Secondary | ICD-10-CM

## 2012-07-29 DIAGNOSIS — I10 Essential (primary) hypertension: Secondary | ICD-10-CM | POA: Insufficient documentation

## 2012-07-29 MED ORDER — LISINOPRIL 10 MG PO TABS: 10.0000 mg | ORAL_TABLET | Freq: Every day | ORAL | Status: DC

## 2012-07-29 MED ORDER — HYDROCHLOROTHIAZIDE 25 MG PO TABS: 25.0000 mg | ORAL_TABLET | Freq: Every day | ORAL | Status: DC

## 2012-07-29 NOTE — Telephone Encounter (Signed)
Pt has been job interviewing and has made contact with PCP.  Needs a supply to hold him over to that.

## 2012-07-31 ENCOUNTER — Encounter (HOSPITAL_COMMUNITY): Payer: Self-pay | Admitting: Psychiatry

## 2012-07-31 ENCOUNTER — Ambulatory Visit (INDEPENDENT_AMBULATORY_CARE_PROVIDER_SITE_OTHER): Payer: BC Managed Care – PPO | Admitting: Psychiatry

## 2012-07-31 VITALS — Wt 182.4 lb

## 2012-07-31 DIAGNOSIS — M549 Dorsalgia, unspecified: Secondary | ICD-10-CM

## 2012-07-31 DIAGNOSIS — F4310 Post-traumatic stress disorder, unspecified: Secondary | ICD-10-CM

## 2012-07-31 DIAGNOSIS — F1910 Other psychoactive substance abuse, uncomplicated: Secondary | ICD-10-CM

## 2012-07-31 DIAGNOSIS — F331 Major depressive disorder, recurrent, moderate: Secondary | ICD-10-CM

## 2012-07-31 DIAGNOSIS — F515 Nightmare disorder: Secondary | ICD-10-CM

## 2012-07-31 DIAGNOSIS — F332 Major depressive disorder, recurrent severe without psychotic features: Secondary | ICD-10-CM

## 2012-07-31 DIAGNOSIS — F1994 Other psychoactive substance use, unspecified with psychoactive substance-induced mood disorder: Secondary | ICD-10-CM

## 2012-07-31 DIAGNOSIS — F411 Generalized anxiety disorder: Secondary | ICD-10-CM

## 2012-07-31 DIAGNOSIS — F410 Panic disorder [episodic paroxysmal anxiety] without agoraphobia: Secondary | ICD-10-CM

## 2012-07-31 MED ORDER — RISPERIDONE 1 MG PO TABS: 1.0000 mg | ORAL_TABLET | Freq: Two times a day (BID) | ORAL | Status: DC

## 2012-07-31 MED ORDER — AMITRIPTYLINE HCL 25 MG PO TABS: 25.0000 mg | ORAL_TABLET | Freq: Every day | ORAL | Status: DC

## 2012-07-31 MED ORDER — SERTRALINE HCL 100 MG PO TABS: 100.0000 mg | ORAL_TABLET | Freq: Every day | ORAL | Status: DC

## 2012-07-31 MED ORDER — PRAZOSIN HCL 1 MG PO CAPS: 3.0000 mg | ORAL_CAPSULE | Freq: Every day | ORAL | Status: DC

## 2012-07-31 MED ORDER — METHOCARBAMOL 750 MG PO TABS: 750.0000 mg | ORAL_TABLET | Freq: Four times a day (QID) | ORAL | Status: DC

## 2012-07-31 MED ORDER — TRAZODONE HCL 100 MG PO TABS: 50.0000 mg | ORAL_TABLET | Freq: Every evening | ORAL | Status: DC | PRN

## 2012-07-31 MED ORDER — LIDOCAINE 5 % EX PTCH: 1.0000 | MEDICATED_PATCH | Freq: Two times a day (BID) | CUTANEOUS | Status: DC
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
## 2012-07-31 NOTE — Patient Instructions (Signed)
CUT BACK/CUT OUT on sugar and carbohydrates, that means very limited fruits and starchy vegetables and very limited grains, breads  The goal is low GLYCEMIC INDEX.  CUT OUT all wheat, rye, or barley for the GLUTEN in them.  HIGH fat and LOW carbohydrate diet is the KEY.  Eat avocados, eggs, lean meat like grass fed beef and chicken  Nuts and seeds would be good foods as well.   Stevia is an excellent sweetener.  Safe for the brain.   Lowella Grip is also a good safe sweetener, not the baking blend form of Truvia  Almond butter is awesome.  Check out all this on the Internet.  Dr Heber Meriden is on the Internet with some good info about this.   http://www.drperlmutter.com is where that is.  An excellent site for info on this diet is http://paleoleap.com  [employer] makes dark chocolate that is sweetened with Stevia that is safe.  Set a timer for 8 minutes and walk for that amount of time in the house or in the yard.  Mark "8" on a calendar for that day.  Do that every day this week.  Then next week increase the time to 9 minutes and then mark the calendar with a 9 for that day.  Each week increase your exercise by one minute.  Keep a record of this so you can see what progress you are making.  Do this every day, just like eating and sleeping.  It is good for pain control, depression, and for your soul/spirit.  Bring the record in for your next visit so we can talk about your effort and how you feel with the new exercise program going and working for you.  Yoga is a very helpful exercise method.  On TV, on line, or by DVD Adelfa Koh is a source of high quality information about yoga and videos on yoga.  Renee Ramus is the world's number one video yoga instructor according to some experts.  There are exceptional health benefits that can be achieved through yoga.  The main principles of yoga is acceptance, no competition, no comparison, and no judgement.  It is exceptional in helping people  meditate and get to a very relaxed state.   Positions that may be helpful for relaxing your back include monkey, plank and side plank.  Lying spinal twist is also very helpful.    Take care of yourself.  No one else is standing up to do the job and only you know what you need.   GET SERIOUS about taking care of yourself.  Do the next right thing and that often means doing something to care for yourself along the lines of are you hungry, are you angry, are you lonely, are you tired, are you scared?  HALTS is what that stands for.  Call if problems or concerns.

## 2012-08-03 ENCOUNTER — Ambulatory Visit (INDEPENDENT_AMBULATORY_CARE_PROVIDER_SITE_OTHER): Payer: BC Managed Care – PPO | Admitting: Psychiatry

## 2012-08-03 ENCOUNTER — Telehealth (HOSPITAL_COMMUNITY): Payer: Self-pay | Admitting: Psychiatry

## 2012-08-03 DIAGNOSIS — F4310 Post-traumatic stress disorder, unspecified: Secondary | ICD-10-CM

## 2012-08-03 DIAGNOSIS — F331 Major depressive disorder, recurrent, moderate: Secondary | ICD-10-CM
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
## 2012-08-03 NOTE — Patient Instructions (Signed)
Discussed orally 

## 2012-08-05 NOTE — Telephone Encounter (Signed)
Form faxed to [employer] this date.

## 2012-08-07 ENCOUNTER — Other Ambulatory Visit (HOSPITAL_COMMUNITY): Payer: Self-pay | Admitting: Psychiatry

## 2012-08-10 ENCOUNTER — Telehealth (HOSPITAL_COMMUNITY): Payer: Self-pay | Admitting: Psychiatry

## 2012-08-10 NOTE — Telephone Encounter (Signed)
Refilled the psychotropic BUT NOT the BP meds.  He was to seek out a new family doctor.  Left message with pharmacy that I do not refill the BP meds.

## 2012-08-17 ENCOUNTER — Ambulatory Visit (INDEPENDENT_AMBULATORY_CARE_PROVIDER_SITE_OTHER): Payer: BC Managed Care – PPO | Admitting: Psychiatry

## 2012-08-17 DIAGNOSIS — F4310 Post-traumatic stress disorder, unspecified: Secondary | ICD-10-CM

## 2012-08-17 DIAGNOSIS — F331 Major depressive disorder, recurrent, moderate: Secondary | ICD-10-CM
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
## 2012-08-17 NOTE — Patient Instructions (Signed)
Discussed orally 

## 2012-08-20 ENCOUNTER — Telehealth (HOSPITAL_COMMUNITY): Payer: Self-pay | Admitting: Psychiatry

## 2012-08-20 NOTE — Telephone Encounter (Signed)
Phone appeal to Dr Cliffton Asters of [employer] was successful and "Approved PPA".  This information tried to be shared with pt, but no answer.  Called pharmacy and they ran it and it was covered.  Requested that the pharmacy call the pt and inform them of that.

## 2012-08-27 ENCOUNTER — Ambulatory Visit (INDEPENDENT_AMBULATORY_CARE_PROVIDER_SITE_OTHER): Payer: BC Managed Care – PPO | Admitting: Psychiatry

## 2012-08-27 ENCOUNTER — Encounter (HOSPITAL_COMMUNITY): Payer: Self-pay | Admitting: Psychiatry

## 2012-08-27 VITALS — BP 132/92 | HR 95 | Ht 73.25 in | Wt 190.8 lb

## 2012-08-27 DIAGNOSIS — F410 Panic disorder [episodic paroxysmal anxiety] without agoraphobia: Secondary | ICD-10-CM

## 2012-08-27 DIAGNOSIS — F1994 Other psychoactive substance use, unspecified with psychoactive substance-induced mood disorder: Secondary | ICD-10-CM

## 2012-08-27 DIAGNOSIS — F332 Major depressive disorder, recurrent severe without psychotic features: Secondary | ICD-10-CM

## 2012-08-27 DIAGNOSIS — F331 Major depressive disorder, recurrent, moderate: Secondary | ICD-10-CM

## 2012-08-27 DIAGNOSIS — M549 Dorsalgia, unspecified: Secondary | ICD-10-CM

## 2012-08-27 DIAGNOSIS — F411 Generalized anxiety disorder: Secondary | ICD-10-CM

## 2012-08-27 DIAGNOSIS — I10 Essential (primary) hypertension: Secondary | ICD-10-CM

## 2012-08-27 DIAGNOSIS — F1910 Other psychoactive substance abuse, uncomplicated: Secondary | ICD-10-CM

## 2012-08-27 DIAGNOSIS — F4310 Post-traumatic stress disorder, unspecified: Secondary | ICD-10-CM

## 2012-08-27 MED ORDER — AMITRIPTYLINE HCL 25 MG PO TABS: 25.0000 mg | ORAL_TABLET | Freq: Every day | ORAL | Status: DC

## 2012-08-27 MED ORDER — LIDOCAINE 5 % EX PTCH: 2.0000 | MEDICATED_PATCH | Freq: Two times a day (BID) | CUTANEOUS | Status: AC

## 2012-08-27 MED ORDER — HYDROXYZINE HCL 50 MG PO TABS: 50.0000 mg | ORAL_TABLET | ORAL | Status: DC | PRN

## 2012-08-27 MED ORDER — TRAZODONE HCL 100 MG PO TABS: 50.0000 mg | ORAL_TABLET | Freq: Every evening | ORAL | Status: DC | PRN

## 2012-08-27 MED ORDER — RISPERIDONE 1 MG PO TABS: 1.0000 mg | ORAL_TABLET | Freq: Two times a day (BID) | ORAL | Status: DC

## 2012-08-27 MED ORDER — METHOCARBAMOL 750 MG PO TABS: 750.0000 mg | ORAL_TABLET | Freq: Four times a day (QID) | ORAL | Status: AC

## 2012-08-27 MED ORDER — SERTRALINE HCL 100 MG PO TABS: 100.0000 mg | ORAL_TABLET | Freq: Every day | ORAL | Status: DC

## 2012-08-27 MED ORDER — PRAZOSIN HCL 1 MG PO CAPS: 3.0000 mg | ORAL_CAPSULE | Freq: Every day | ORAL | Status: DC

## 2012-08-27 MED ORDER — LISINOPRIL 10 MG PO TABS: 10.0000 mg | ORAL_TABLET | Freq: Every day | ORAL | Status: AC

## 2012-08-27 MED ORDER — HYDROCHLOROTHIAZIDE 25 MG PO TABS: 25.0000 mg | ORAL_TABLET | Freq: Every day | ORAL | Status: AC
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
## 2012-08-27 NOTE — Patient Instructions (Addendum)
Discuss with Dr Margo Aye about considering continuing the Lidoderm patches as well as adding Robaxin for the muscle relaxation.  This is all part of non narcotic pain management.   Set a timer for 8 or a certain number minutes and walk for that amount of time in the house or in the yard.  Mark the number of minutes on a calendar for that day.  Do that every day this week.  Then next week increase the time by 1 minutes and then mark the calendar with the number of minutes for that day.  Each week increase your exercise by one minute.  Keep a record of this so you can see the progress you are making.  Do this every day, just like eating and sleeping.  It is good for pain control, depression, and for your soul/spirit.  Bring the record in for your next visit so we can talk about your effort and how you feel with the new exercise program going and working for you.  Relaxation is the ultimate solution for you.  You can seek it through tub baths, bubble baths, essential oils or incense, walking or chatting with friends, listening to soft music, watching a candle burn and just letting all thoughts go and appreciating the true essence of the Creator.  Pets or animals may be very helpful.  You might spend some time with them and then go do more directed meditation.  "I am Wishes Fulfilled Meditation" by Marylene Buerger and Lyndal Pulley may be helpful MUSIC for getting to sleep or for meditating You can order it from on line.  You might find the Chill channel on Pandora and explore the artists that you like better.   Take care of yourself.  No one else is standing up to do the job and only you know what you need.   GET SERIOUS about taking care of yourself.  Do the next right thing and that often means doing something to care for yourself along the lines of are you hungry, are you angry, are you lonely, are you tired, are you scared?  [employer] is what that stands for.  Call if problems or concerns.

## 2012-08-31 ENCOUNTER — Ambulatory Visit (INDEPENDENT_AMBULATORY_CARE_PROVIDER_SITE_OTHER): Payer: BC Managed Care – PPO | Admitting: Psychiatry

## 2012-08-31 DIAGNOSIS — F4310 Post-traumatic stress disorder, unspecified: Secondary | ICD-10-CM

## 2012-08-31 DIAGNOSIS — F331 Major depressive disorder, recurrent, moderate: Secondary | ICD-10-CM

## 2012-08-31 NOTE — Patient Instructions (Signed)
Discussed orally 
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
## 2012-09-21 ENCOUNTER — Ambulatory Visit (INDEPENDENT_AMBULATORY_CARE_PROVIDER_SITE_OTHER): Payer: BC Managed Care – PPO | Admitting: Psychiatry

## 2012-09-21 DIAGNOSIS — F331 Major depressive disorder, recurrent, moderate: Secondary | ICD-10-CM

## 2012-09-21 DIAGNOSIS — F4310 Post-traumatic stress disorder, unspecified: Secondary | ICD-10-CM

## 2012-09-21 NOTE — Patient Instructions (Signed)
Discussed orally 
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
## 2012-09-28 ENCOUNTER — Telehealth (HOSPITAL_COMMUNITY): Payer: Self-pay | Admitting: Psychiatry

## 2012-09-28 DIAGNOSIS — F331 Major depressive disorder, recurrent, moderate: Secondary | ICD-10-CM

## 2012-09-28 NOTE — Telephone Encounter (Signed)
Pt calls for refills.  Pharmacy shows that he has refills.  Asked them to refill for him.

## 2012-10-05 ENCOUNTER — Ambulatory Visit (INDEPENDENT_AMBULATORY_CARE_PROVIDER_SITE_OTHER): Payer: No Typology Code available for payment source | Admitting: Psychiatry

## 2012-10-05 DIAGNOSIS — F331 Major depressive disorder, recurrent, moderate: Secondary | ICD-10-CM

## 2012-10-05 DIAGNOSIS — F4310 Post-traumatic stress disorder, unspecified: Secondary | ICD-10-CM

## 2012-10-05 NOTE — Patient Instructions (Signed)
Discussed orally 
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
## 2012-10-19 ENCOUNTER — Ambulatory Visit (INDEPENDENT_AMBULATORY_CARE_PROVIDER_SITE_OTHER): Payer: No Typology Code available for payment source | Admitting: Psychiatry

## 2012-10-19 DIAGNOSIS — F4310 Post-traumatic stress disorder, unspecified: Secondary | ICD-10-CM

## 2012-10-19 DIAGNOSIS — F331 Major depressive disorder, recurrent, moderate: Secondary | ICD-10-CM
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
## 2012-10-20 NOTE — Patient Instructions (Signed)
Discussed orally 

## 2012-10-27 ENCOUNTER — Ambulatory Visit (HOSPITAL_COMMUNITY): Payer: Self-pay | Admitting: Psychiatry

## 2012-10-29 ENCOUNTER — Ambulatory Visit (INDEPENDENT_AMBULATORY_CARE_PROVIDER_SITE_OTHER): Payer: No Typology Code available for payment source | Admitting: Psychiatry

## 2012-10-29 ENCOUNTER — Encounter (HOSPITAL_COMMUNITY): Payer: Self-pay | Admitting: Psychiatry

## 2012-10-29 VITALS — BP 115/76 | HR 75 | Wt 197.0 lb

## 2012-10-29 DIAGNOSIS — F339 Major depressive disorder, recurrent, unspecified: Secondary | ICD-10-CM

## 2012-10-29 DIAGNOSIS — F411 Generalized anxiety disorder: Secondary | ICD-10-CM

## 2012-10-29 DIAGNOSIS — I10 Essential (primary) hypertension: Secondary | ICD-10-CM | POA: Insufficient documentation

## 2012-10-29 DIAGNOSIS — F4310 Post-traumatic stress disorder, unspecified: Secondary | ICD-10-CM

## 2012-10-29 DIAGNOSIS — F1010 Alcohol abuse, uncomplicated: Secondary | ICD-10-CM

## 2012-10-29 DIAGNOSIS — F331 Major depressive disorder, recurrent, moderate: Secondary | ICD-10-CM

## 2012-10-29 DIAGNOSIS — J45909 Unspecified asthma, uncomplicated: Secondary | ICD-10-CM | POA: Insufficient documentation

## 2012-10-29 MED ORDER — SERTRALINE HCL 100 MG PO TABS: ORAL_TABLET | ORAL | Status: DC

## 2012-10-29 MED ORDER — GABAPENTIN 100 MG PO CAPS: 100.0000 mg | ORAL_CAPSULE | Freq: Three times a day (TID) | ORAL | Status: DC

## 2012-10-29 MED ORDER — RISPERIDONE 1 MG PO TABS: 1.0000 mg | ORAL_TABLET | Freq: Two times a day (BID) | ORAL | Status: DC

## 2012-10-29 MED ORDER — HYDROXYZINE HCL 50 MG PO TABS: 50.0000 mg | ORAL_TABLET | Freq: Three times a day (TID) | ORAL | Status: DC | PRN

## 2012-10-29 MED ORDER — PRAZOSIN HCL 1 MG PO CAPS: 3.0000 mg | ORAL_CAPSULE | Freq: Every day | ORAL | Status: DC

## 2012-10-29 MED ORDER — TRAZODONE HCL 100 MG PO TABS: 50.0000 mg | ORAL_TABLET | Freq: Every evening | ORAL | Status: DC | PRN
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
## 2012-11-02 ENCOUNTER — Ambulatory Visit (INDEPENDENT_AMBULATORY_CARE_PROVIDER_SITE_OTHER): Payer: No Typology Code available for payment source | Admitting: Psychiatry

## 2012-11-02 DIAGNOSIS — F331 Major depressive disorder, recurrent, moderate: Secondary | ICD-10-CM

## 2012-11-02 DIAGNOSIS — F4310 Post-traumatic stress disorder, unspecified: Secondary | ICD-10-CM

## 2012-11-03 NOTE — Patient Instructions (Signed)
Discussed orally 
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
## 2012-11-23 ENCOUNTER — Ambulatory Visit (INDEPENDENT_AMBULATORY_CARE_PROVIDER_SITE_OTHER): Payer: BC Managed Care – PPO | Admitting: Psychiatry

## 2012-11-23 DIAGNOSIS — F4310 Post-traumatic stress disorder, unspecified: Secondary | ICD-10-CM

## 2012-11-23 DIAGNOSIS — F331 Major depressive disorder, recurrent, moderate: Secondary | ICD-10-CM

## 2012-11-23 NOTE — Patient Instructions (Signed)
Discussed orally 
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
## 2012-11-30 ENCOUNTER — Telehealth (HOSPITAL_COMMUNITY): Payer: Self-pay | Admitting: Psychiatry

## 2012-11-30 DIAGNOSIS — F4310 Post-traumatic stress disorder, unspecified: Secondary | ICD-10-CM

## 2012-11-30 DIAGNOSIS — F331 Major depressive disorder, recurrent, moderate: Secondary | ICD-10-CM

## 2012-11-30 DIAGNOSIS — F411 Generalized anxiety disorder: Secondary | ICD-10-CM

## 2012-12-01 MED ORDER — TRAZODONE HCL 100 MG PO TABS: 50.0000 mg | ORAL_TABLET | Freq: Every evening | ORAL | Status: DC | PRN

## 2012-12-01 MED ORDER — RISPERIDONE 1 MG PO TABS: 1.0000 mg | ORAL_TABLET | Freq: Two times a day (BID) | ORAL | Status: DC

## 2012-12-01 MED ORDER — PRAZOSIN HCL 1 MG PO CAPS: 3.0000 mg | ORAL_CAPSULE | Freq: Every day | ORAL | Status: DC

## 2012-12-01 MED ORDER — GABAPENTIN 100 MG PO CAPS: 100.0000 mg | ORAL_CAPSULE | Freq: Three times a day (TID) | ORAL | Status: DC

## 2012-12-01 MED ORDER — HYDROXYZINE HCL 50 MG PO TABS: 50.0000 mg | ORAL_TABLET | Freq: Three times a day (TID) | ORAL | Status: DC | PRN

## 2012-12-01 MED ORDER — SERTRALINE HCL 100 MG PO TABS: ORAL_TABLET | ORAL | Status: DC

## 2012-12-01 NOTE — Telephone Encounter (Signed)
Medications refilled

## 2012-12-07 ENCOUNTER — Ambulatory Visit (INDEPENDENT_AMBULATORY_CARE_PROVIDER_SITE_OTHER): Payer: BC Managed Care – PPO | Admitting: Psychiatry

## 2012-12-07 DIAGNOSIS — F331 Major depressive disorder, recurrent, moderate: Secondary | ICD-10-CM

## 2012-12-07 DIAGNOSIS — F4310 Post-traumatic stress disorder, unspecified: Secondary | ICD-10-CM
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
## 2012-12-07 NOTE — Patient Instructions (Signed)
Discussed orally 

## 2012-12-10 ENCOUNTER — Ambulatory Visit (HOSPITAL_COMMUNITY): Payer: Self-pay | Admitting: Psychiatry

## 2012-12-11 ENCOUNTER — Ambulatory Visit (INDEPENDENT_AMBULATORY_CARE_PROVIDER_SITE_OTHER): Payer: BC Managed Care – PPO | Admitting: Psychiatry

## 2012-12-11 ENCOUNTER — Encounter (HOSPITAL_COMMUNITY): Payer: Self-pay | Admitting: Psychiatry

## 2012-12-11 VITALS — BP 120/80 | Ht 75.0 in | Wt 200.0 lb

## 2012-12-11 DIAGNOSIS — IMO0002 Reserved for concepts with insufficient information to code with codable children: Secondary | ICD-10-CM

## 2012-12-11 DIAGNOSIS — F411 Generalized anxiety disorder: Secondary | ICD-10-CM

## 2012-12-11 DIAGNOSIS — F4310 Post-traumatic stress disorder, unspecified: Secondary | ICD-10-CM

## 2012-12-11 DIAGNOSIS — F331 Major depressive disorder, recurrent, moderate: Secondary | ICD-10-CM

## 2012-12-11 MED ORDER — HYDROXYZINE HCL 50 MG PO TABS: 50.0000 mg | ORAL_TABLET | Freq: Three times a day (TID) | ORAL | Status: DC | PRN

## 2012-12-11 MED ORDER — RISPERIDONE 1 MG PO TABS: 1.0000 mg | ORAL_TABLET | Freq: Two times a day (BID) | ORAL | Status: DC

## 2012-12-11 MED ORDER — SERTRALINE HCL 100 MG PO TABS: ORAL_TABLET | ORAL | Status: DC

## 2012-12-11 MED ORDER — TRAZODONE HCL 100 MG PO TABS: 200.0000 mg | ORAL_TABLET | Freq: Every day | ORAL | Status: DC

## 2012-12-11 MED ORDER — GABAPENTIN 100 MG PO CAPS: 100.0000 mg | ORAL_CAPSULE | Freq: Three times a day (TID) | ORAL | Status: DC

## 2012-12-11 MED ORDER — PRAZOSIN HCL 1 MG PO CAPS: 3.0000 mg | ORAL_CAPSULE | Freq: Every day | ORAL | Status: DC
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
## 2012-12-29 ENCOUNTER — Telehealth (HOSPITAL_COMMUNITY): Payer: Self-pay

## 2012-12-29 NOTE — Telephone Encounter (Signed)
All meds at pharmacy

## 2013-01-04 ENCOUNTER — Ambulatory Visit (HOSPITAL_COMMUNITY): Payer: Self-pay | Admitting: Psychiatry

## 2013-01-13 ENCOUNTER — Ambulatory Visit (INDEPENDENT_AMBULATORY_CARE_PROVIDER_SITE_OTHER): Payer: BC Managed Care – PPO | Admitting: Psychiatry

## 2013-01-13 DIAGNOSIS — F331 Major depressive disorder, recurrent, moderate: Secondary | ICD-10-CM

## 2013-01-13 DIAGNOSIS — F4310 Post-traumatic stress disorder, unspecified: Secondary | ICD-10-CM

## 2013-01-13 NOTE — Patient Instructions (Signed)
Discussed orally 
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
## 2013-01-28 ENCOUNTER — Other Ambulatory Visit (HOSPITAL_COMMUNITY): Payer: Self-pay | Admitting: Psychiatry

## 2013-01-28 ENCOUNTER — Telehealth (HOSPITAL_COMMUNITY): Payer: Self-pay | Admitting: Psychiatry

## 2013-01-28 DIAGNOSIS — F411 Generalized anxiety disorder: Secondary | ICD-10-CM

## 2013-01-28 DIAGNOSIS — F4310 Post-traumatic stress disorder, unspecified: Secondary | ICD-10-CM

## 2013-01-28 DIAGNOSIS — F331 Major depressive disorder, recurrent, moderate: Secondary | ICD-10-CM

## 2013-01-28 MED ORDER — PRAZOSIN HCL 1 MG PO CAPS: 3.0000 mg | ORAL_CAPSULE | Freq: Every day | ORAL | Status: DC

## 2013-01-28 MED ORDER — GABAPENTIN 100 MG PO CAPS: 100.0000 mg | ORAL_CAPSULE | Freq: Three times a day (TID) | ORAL | Status: DC

## 2013-01-28 MED ORDER — HYDROXYZINE HCL 50 MG PO TABS: 50.0000 mg | ORAL_TABLET | Freq: Three times a day (TID) | ORAL | Status: DC | PRN

## 2013-01-28 NOTE — Telephone Encounter (Signed)
Sent to pharmacy 

## 2013-02-08 ENCOUNTER — Ambulatory Visit (HOSPITAL_COMMUNITY): Payer: Self-pay | Admitting: Psychiatry

## 2013-02-12 ENCOUNTER — Ambulatory Visit (HOSPITAL_COMMUNITY): Payer: Self-pay | Admitting: Psychiatry

## 2013-02-16 ENCOUNTER — Encounter (HOSPITAL_COMMUNITY): Payer: Self-pay | Admitting: Psychiatry

## 2013-02-16 ENCOUNTER — Ambulatory Visit (INDEPENDENT_AMBULATORY_CARE_PROVIDER_SITE_OTHER): Payer: BC Managed Care – PPO | Admitting: Psychiatry

## 2013-02-16 VITALS — BP 130/70 | Ht 75.0 in | Wt 202.0 lb

## 2013-02-16 DIAGNOSIS — F515 Nightmare disorder: Secondary | ICD-10-CM

## 2013-02-16 DIAGNOSIS — F411 Generalized anxiety disorder: Secondary | ICD-10-CM

## 2013-02-16 DIAGNOSIS — F339 Major depressive disorder, recurrent, unspecified: Secondary | ICD-10-CM

## 2013-02-16 DIAGNOSIS — F1010 Alcohol abuse, uncomplicated: Secondary | ICD-10-CM

## 2013-02-16 DIAGNOSIS — F4310 Post-traumatic stress disorder, unspecified: Secondary | ICD-10-CM

## 2013-02-16 DIAGNOSIS — F331 Major depressive disorder, recurrent, moderate: Secondary | ICD-10-CM

## 2013-02-16 MED ORDER — HYDROXYZINE HCL 50 MG PO TABS: 50.0000 mg | ORAL_TABLET | Freq: Three times a day (TID) | ORAL | Status: DC | PRN

## 2013-02-16 MED ORDER — TRAZODONE HCL 100 MG PO TABS: 200.0000 mg | ORAL_TABLET | Freq: Every day | ORAL | Status: DC

## 2013-02-16 MED ORDER — PRAZOSIN HCL 1 MG PO CAPS: 3.0000 mg | ORAL_CAPSULE | Freq: Every day | ORAL | Status: DC

## 2013-02-16 MED ORDER — RISPERIDONE 1 MG PO TABS: 1.0000 mg | ORAL_TABLET | Freq: Two times a day (BID) | ORAL | Status: DC

## 2013-02-16 MED ORDER — GABAPENTIN 100 MG PO CAPS: 100.0000 mg | ORAL_CAPSULE | Freq: Three times a day (TID) | ORAL | Status: DC

## 2013-02-16 MED ORDER — SERTRALINE HCL 100 MG PO TABS: ORAL_TABLET | ORAL | Status: DC
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
## 2013-02-17 ENCOUNTER — Ambulatory Visit (INDEPENDENT_AMBULATORY_CARE_PROVIDER_SITE_OTHER): Payer: BC Managed Care – PPO | Admitting: Psychiatry

## 2013-02-17 DIAGNOSIS — F4310 Post-traumatic stress disorder, unspecified: Secondary | ICD-10-CM

## 2013-02-17 DIAGNOSIS — F331 Major depressive disorder, recurrent, moderate: Secondary | ICD-10-CM
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
## 2013-02-17 NOTE — Patient Instructions (Signed)
Discussed orally 

## 2013-04-06 ENCOUNTER — Telehealth (HOSPITAL_COMMUNITY): Payer: Self-pay

## 2013-04-06 ENCOUNTER — Ambulatory Visit (INDEPENDENT_AMBULATORY_CARE_PROVIDER_SITE_OTHER): Payer: BC Managed Care – PPO | Admitting: Psychiatry

## 2013-04-06 ENCOUNTER — Other Ambulatory Visit (HOSPITAL_COMMUNITY): Payer: Self-pay | Admitting: Psychiatry

## 2013-04-06 DIAGNOSIS — F4310 Post-traumatic stress disorder, unspecified: Secondary | ICD-10-CM

## 2013-04-06 DIAGNOSIS — F411 Generalized anxiety disorder: Secondary | ICD-10-CM

## 2013-04-06 DIAGNOSIS — F331 Major depressive disorder, recurrent, moderate: Secondary | ICD-10-CM

## 2013-04-06 MED ORDER — PRAZOSIN HCL 1 MG PO CAPS: 3.0000 mg | ORAL_CAPSULE | Freq: Every day | ORAL | Status: DC

## 2013-04-06 MED ORDER — GABAPENTIN 100 MG PO CAPS: 100.0000 mg | ORAL_CAPSULE | Freq: Three times a day (TID) | ORAL | Status: DC

## 2013-04-06 MED ORDER — HYDROXYZINE HCL 50 MG PO TABS: 50.0000 mg | ORAL_TABLET | Freq: Three times a day (TID) | ORAL | Status: DC | PRN

## 2013-04-06 NOTE — Patient Instructions (Signed)
Discussed orally 

## 2013-04-06 NOTE — Telephone Encounter (Signed)
done
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
## 2013-05-07 ENCOUNTER — Ambulatory Visit (INDEPENDENT_AMBULATORY_CARE_PROVIDER_SITE_OTHER): Payer: BC Managed Care – PPO | Admitting: Psychiatry

## 2013-05-07 DIAGNOSIS — F331 Major depressive disorder, recurrent, moderate: Secondary | ICD-10-CM

## 2013-05-07 DIAGNOSIS — F4310 Post-traumatic stress disorder, unspecified: Secondary | ICD-10-CM

## 2013-05-07 NOTE — Patient Instructions (Signed)
Discussed orally 
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
## 2013-05-19 ENCOUNTER — Encounter (HOSPITAL_COMMUNITY): Payer: Self-pay | Admitting: Psychiatry

## 2013-05-19 ENCOUNTER — Ambulatory Visit (INDEPENDENT_AMBULATORY_CARE_PROVIDER_SITE_OTHER): Payer: No Typology Code available for payment source | Admitting: Psychiatry

## 2013-05-19 VITALS — BP 130/80 | Ht 75.0 in | Wt 209.0 lb

## 2013-05-19 DIAGNOSIS — F515 Nightmare disorder: Secondary | ICD-10-CM

## 2013-05-19 DIAGNOSIS — F4310 Post-traumatic stress disorder, unspecified: Secondary | ICD-10-CM

## 2013-05-19 DIAGNOSIS — F4312 Post-traumatic stress disorder, chronic: Secondary | ICD-10-CM

## 2013-05-19 DIAGNOSIS — IMO0002 Reserved for concepts with insufficient information to code with codable children: Secondary | ICD-10-CM

## 2013-05-19 DIAGNOSIS — F411 Generalized anxiety disorder: Secondary | ICD-10-CM

## 2013-05-19 DIAGNOSIS — F331 Major depressive disorder, recurrent, moderate: Secondary | ICD-10-CM

## 2013-05-19 MED ORDER — GABAPENTIN 100 MG PO CAPS: 100.0000 mg | ORAL_CAPSULE | Freq: Three times a day (TID) | ORAL | Status: DC

## 2013-05-19 MED ORDER — RISPERIDONE 1 MG PO TABS: 1.0000 mg | ORAL_TABLET | Freq: Two times a day (BID) | ORAL | Status: DC

## 2013-05-19 MED ORDER — PRAZOSIN HCL 1 MG PO CAPS: 3.0000 mg | ORAL_CAPSULE | Freq: Every day | ORAL | Status: DC

## 2013-05-19 MED ORDER — TRAZODONE HCL 100 MG PO TABS: 200.0000 mg | ORAL_TABLET | Freq: Every day | ORAL | Status: DC

## 2013-05-19 MED ORDER — SERTRALINE HCL 100 MG PO TABS: ORAL_TABLET | ORAL | Status: DC
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
## 2013-06-04 ENCOUNTER — Other Ambulatory Visit (HOSPITAL_COMMUNITY): Payer: Self-pay | Admitting: Psychiatry

## 2013-06-04 ENCOUNTER — Telehealth (HOSPITAL_COMMUNITY): Payer: Self-pay | Admitting: *Deleted

## 2013-06-04 ENCOUNTER — Ambulatory Visit (INDEPENDENT_AMBULATORY_CARE_PROVIDER_SITE_OTHER): Payer: BC Managed Care – PPO | Admitting: Psychiatry

## 2013-06-04 DIAGNOSIS — F331 Major depressive disorder, recurrent, moderate: Secondary | ICD-10-CM

## 2013-06-04 DIAGNOSIS — F4310 Post-traumatic stress disorder, unspecified: Secondary | ICD-10-CM

## 2013-06-04 DIAGNOSIS — F411 Generalized anxiety disorder: Secondary | ICD-10-CM

## 2013-06-04 MED ORDER — HYDROXYZINE HCL 50 MG PO TABS: 50.0000 mg | ORAL_TABLET | Freq: Three times a day (TID) | ORAL | Status: DC | PRN

## 2013-06-04 NOTE — Telephone Encounter (Signed)
done

## 2013-06-04 NOTE — Patient Instructions (Signed)
Discussed orally 
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
## 2013-06-28 ENCOUNTER — Ambulatory Visit (INDEPENDENT_AMBULATORY_CARE_PROVIDER_SITE_OTHER): Payer: BC Managed Care – PPO | Admitting: Psychiatry

## 2013-06-28 ENCOUNTER — Encounter (HOSPITAL_COMMUNITY): Payer: Self-pay | Admitting: Psychiatry

## 2013-06-28 VITALS — BP 130/80 | Ht 75.0 in | Wt 212.0 lb

## 2013-06-28 DIAGNOSIS — F4310 Post-traumatic stress disorder, unspecified: Secondary | ICD-10-CM

## 2013-06-28 DIAGNOSIS — F411 Generalized anxiety disorder: Secondary | ICD-10-CM

## 2013-06-28 DIAGNOSIS — F331 Major depressive disorder, recurrent, moderate: Secondary | ICD-10-CM

## 2013-06-28 MED ORDER — RISPERIDONE 1 MG PO TABS: 1.0000 mg | ORAL_TABLET | Freq: Two times a day (BID) | ORAL | Status: DC

## 2013-06-28 MED ORDER — TRAZODONE HCL 100 MG PO TABS: 200.0000 mg | ORAL_TABLET | Freq: Every day | ORAL | Status: DC

## 2013-06-28 MED ORDER — PRAZOSIN HCL 5 MG PO CAPS: 5.0000 mg | ORAL_CAPSULE | Freq: Every day | ORAL | Status: DC

## 2013-06-28 MED ORDER — HYDROXYZINE HCL 50 MG PO TABS: 50.0000 mg | ORAL_TABLET | Freq: Three times a day (TID) | ORAL | Status: DC | PRN

## 2013-06-28 MED ORDER — GABAPENTIN 100 MG PO CAPS: 100.0000 mg | ORAL_CAPSULE | Freq: Three times a day (TID) | ORAL | Status: DC

## 2013-06-28 MED ORDER — SERTRALINE HCL 100 MG PO TABS: ORAL_TABLET | ORAL | Status: DC
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
## 2013-07-02 ENCOUNTER — Ambulatory Visit (INDEPENDENT_AMBULATORY_CARE_PROVIDER_SITE_OTHER): Payer: BC Managed Care – PPO | Admitting: Psychiatry

## 2013-07-02 DIAGNOSIS — F4310 Post-traumatic stress disorder, unspecified: Secondary | ICD-10-CM

## 2013-07-02 DIAGNOSIS — F331 Major depressive disorder, recurrent, moderate: Secondary | ICD-10-CM

## 2013-07-02 NOTE — Patient Instructions (Signed)
Discussed orally 
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
## 2013-08-02 ENCOUNTER — Ambulatory Visit (INDEPENDENT_AMBULATORY_CARE_PROVIDER_SITE_OTHER): Payer: BC Managed Care – PPO | Admitting: Psychiatry

## 2013-08-02 DIAGNOSIS — F331 Major depressive disorder, recurrent, moderate: Secondary | ICD-10-CM

## 2013-08-02 DIAGNOSIS — F4310 Post-traumatic stress disorder, unspecified: Secondary | ICD-10-CM

## 2013-08-02 NOTE — Patient Instructions (Signed)
Discussed orally 
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
## 2013-08-20 ENCOUNTER — Other Ambulatory Visit (HOSPITAL_COMMUNITY): Payer: Self-pay | Admitting: Psychiatry

## 2013-08-20 ENCOUNTER — Telehealth (HOSPITAL_COMMUNITY): Payer: Self-pay | Admitting: *Deleted

## 2013-08-20 DIAGNOSIS — F331 Major depressive disorder, recurrent, moderate: Secondary | ICD-10-CM

## 2013-08-20 MED ORDER — GABAPENTIN 100 MG PO CAPS: 100.0000 mg | ORAL_CAPSULE | Freq: Three times a day (TID) | ORAL | Status: DC

## 2013-08-20 NOTE — Telephone Encounter (Signed)
done

## 2013-08-27 ENCOUNTER — Encounter (HOSPITAL_COMMUNITY): Payer: Self-pay | Admitting: Psychiatry

## 2013-08-27 ENCOUNTER — Ambulatory Visit (INDEPENDENT_AMBULATORY_CARE_PROVIDER_SITE_OTHER): Payer: BC Managed Care – PPO | Admitting: Psychiatry

## 2013-08-27 VITALS — BP 120/82 | Ht 75.0 in | Wt 207.0 lb

## 2013-08-27 DIAGNOSIS — F331 Major depressive disorder, recurrent, moderate: Secondary | ICD-10-CM

## 2013-08-27 DIAGNOSIS — F4310 Post-traumatic stress disorder, unspecified: Secondary | ICD-10-CM

## 2013-08-27 DIAGNOSIS — F1010 Alcohol abuse, uncomplicated: Secondary | ICD-10-CM

## 2013-08-27 DIAGNOSIS — F411 Generalized anxiety disorder: Secondary | ICD-10-CM

## 2013-08-27 DIAGNOSIS — F339 Major depressive disorder, recurrent, unspecified: Secondary | ICD-10-CM

## 2013-08-27 MED ORDER — RISPERIDONE 1 MG PO TABS: 1.0000 mg | ORAL_TABLET | Freq: Two times a day (BID) | ORAL | Status: DC

## 2013-08-27 MED ORDER — PRAZOSIN HCL 5 MG PO CAPS: 5.0000 mg | ORAL_CAPSULE | Freq: Every day | ORAL | Status: DC

## 2013-08-27 MED ORDER — GABAPENTIN 100 MG PO CAPS: 100.0000 mg | ORAL_CAPSULE | Freq: Three times a day (TID) | ORAL | Status: DC

## 2013-08-27 MED ORDER — SERTRALINE HCL 100 MG PO TABS: ORAL_TABLET | ORAL | Status: DC

## 2013-08-27 MED ORDER — TRAZODONE HCL 100 MG PO TABS: 200.0000 mg | ORAL_TABLET | Freq: Every day | ORAL | Status: DC

## 2013-08-27 MED ORDER — HYDROXYZINE HCL 50 MG PO TABS: 50.0000 mg | ORAL_TABLET | Freq: Three times a day (TID) | ORAL | Status: DC | PRN
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
## 2013-09-06 ENCOUNTER — Ambulatory Visit (HOSPITAL_COMMUNITY): Payer: Self-pay | Admitting: Psychiatry

## 2013-09-14 ENCOUNTER — Ambulatory Visit (HOSPITAL_COMMUNITY): Payer: Self-pay | Admitting: Psychiatry

## 2013-09-28 ENCOUNTER — Ambulatory Visit (INDEPENDENT_AMBULATORY_CARE_PROVIDER_SITE_OTHER): Payer: BC Managed Care – PPO | Admitting: Psychiatry

## 2013-09-28 DIAGNOSIS — F4310 Post-traumatic stress disorder, unspecified: Secondary | ICD-10-CM

## 2013-09-28 DIAGNOSIS — F331 Major depressive disorder, recurrent, moderate: Secondary | ICD-10-CM
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
## 2013-09-28 NOTE — Patient Instructions (Signed)
Discussed orally 

## 2013-10-21 ENCOUNTER — Telehealth (HOSPITAL_COMMUNITY): Payer: Self-pay | Admitting: *Deleted

## 2013-10-26 ENCOUNTER — Ambulatory Visit (INDEPENDENT_AMBULATORY_CARE_PROVIDER_SITE_OTHER): Payer: No Typology Code available for payment source | Admitting: Psychiatry

## 2013-10-26 DIAGNOSIS — F331 Major depressive disorder, recurrent, moderate: Secondary | ICD-10-CM

## 2013-10-26 DIAGNOSIS — F4310 Post-traumatic stress disorder, unspecified: Secondary | ICD-10-CM

## 2013-10-26 NOTE — Patient Instructions (Signed)
Discussed orally 
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
## 2013-11-12 ENCOUNTER — Ambulatory Visit (INDEPENDENT_AMBULATORY_CARE_PROVIDER_SITE_OTHER): Payer: No Typology Code available for payment source | Admitting: Psychiatry

## 2013-11-12 DIAGNOSIS — F331 Major depressive disorder, recurrent, moderate: Secondary | ICD-10-CM

## 2013-11-12 DIAGNOSIS — F4310 Post-traumatic stress disorder, unspecified: Secondary | ICD-10-CM

## 2013-11-12 NOTE — Patient Instructions (Signed)
Discussed orally 
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
## 2013-11-25 ENCOUNTER — Ambulatory Visit (INDEPENDENT_AMBULATORY_CARE_PROVIDER_SITE_OTHER): Payer: No Typology Code available for payment source | Admitting: Psychiatry

## 2013-11-25 ENCOUNTER — Encounter (HOSPITAL_COMMUNITY): Payer: Self-pay | Admitting: Psychiatry

## 2013-11-25 VITALS — BP 125/88 | HR 73 | Ht 75.0 in | Wt 213.4 lb

## 2013-11-25 DIAGNOSIS — F331 Major depressive disorder, recurrent, moderate: Secondary | ICD-10-CM

## 2013-11-25 DIAGNOSIS — F411 Generalized anxiety disorder: Secondary | ICD-10-CM

## 2013-11-25 DIAGNOSIS — F4310 Post-traumatic stress disorder, unspecified: Secondary | ICD-10-CM

## 2013-11-25 MED ORDER — SERTRALINE HCL 100 MG PO TABS: ORAL_TABLET | ORAL | Status: DC

## 2013-11-25 MED ORDER — GABAPENTIN 100 MG PO CAPS: 100.0000 mg | ORAL_CAPSULE | Freq: Three times a day (TID) | ORAL | Status: DC

## 2013-11-25 MED ORDER — PRAZOSIN HCL 5 MG PO CAPS: 5.0000 mg | ORAL_CAPSULE | Freq: Every day | ORAL | Status: DC

## 2013-11-25 MED ORDER — TRAZODONE HCL 100 MG PO TABS: 200.0000 mg | ORAL_TABLET | Freq: Every day | ORAL | Status: DC

## 2013-11-25 MED ORDER — HYDROXYZINE HCL 50 MG PO TABS: 50.0000 mg | ORAL_TABLET | Freq: Three times a day (TID) | ORAL | Status: DC

## 2013-11-25 MED ORDER — RISPERIDONE 1 MG PO TABS: 1.0000 mg | ORAL_TABLET | Freq: Two times a day (BID) | ORAL | Status: DC
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
## 2013-11-26 ENCOUNTER — Ambulatory Visit (HOSPITAL_COMMUNITY): Payer: Self-pay | Admitting: Psychiatry

## 2013-11-30 ENCOUNTER — Ambulatory Visit (INDEPENDENT_AMBULATORY_CARE_PROVIDER_SITE_OTHER): Payer: No Typology Code available for payment source | Admitting: Psychiatry

## 2013-11-30 DIAGNOSIS — F331 Major depressive disorder, recurrent, moderate: Secondary | ICD-10-CM
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
## 2013-11-30 NOTE — Patient Instructions (Signed)
Discussed orally 

## 2013-12-20 ENCOUNTER — Ambulatory Visit (INDEPENDENT_AMBULATORY_CARE_PROVIDER_SITE_OTHER): Payer: No Typology Code available for payment source | Admitting: Psychiatry

## 2013-12-20 DIAGNOSIS — F4310 Post-traumatic stress disorder, unspecified: Secondary | ICD-10-CM

## 2013-12-20 DIAGNOSIS — F331 Major depressive disorder, recurrent, moderate: Secondary | ICD-10-CM
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
## 2013-12-20 NOTE — Patient Instructions (Signed)
Discussed orally 

## 2014-01-05 ENCOUNTER — Ambulatory Visit (INDEPENDENT_AMBULATORY_CARE_PROVIDER_SITE_OTHER): Payer: No Typology Code available for payment source | Admitting: Psychiatry

## 2014-01-05 DIAGNOSIS — F4310 Post-traumatic stress disorder, unspecified: Secondary | ICD-10-CM

## 2014-01-05 DIAGNOSIS — F331 Major depressive disorder, recurrent, moderate: Secondary | ICD-10-CM
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
## 2014-01-05 NOTE — Patient Instructions (Signed)
Discussed orally 

## 2014-01-21 ENCOUNTER — Other Ambulatory Visit (HOSPITAL_COMMUNITY): Payer: Self-pay | Admitting: Psychiatry

## 2014-01-21 ENCOUNTER — Telehealth (HOSPITAL_COMMUNITY): Payer: Self-pay | Admitting: *Deleted

## 2014-01-21 DIAGNOSIS — F331 Major depressive disorder, recurrent, moderate: Secondary | ICD-10-CM

## 2014-01-21 MED ORDER — GABAPENTIN 100 MG PO CAPS: 100.0000 mg | ORAL_CAPSULE | Freq: Three times a day (TID) | ORAL | Status: DC

## 2014-01-21 NOTE — Telephone Encounter (Signed)
done

## 2014-01-21 NOTE — Telephone Encounter (Signed)
Pt pharmacy requesting pt Gabapentin 100mg . Last refilled medication 11-25-13 90 tabs 0 refills. Next appt 02-25-14.

## 2014-01-24 ENCOUNTER — Ambulatory Visit (INDEPENDENT_AMBULATORY_CARE_PROVIDER_SITE_OTHER): Payer: No Typology Code available for payment source | Admitting: Psychiatry

## 2014-01-24 DIAGNOSIS — F4310 Post-traumatic stress disorder, unspecified: Secondary | ICD-10-CM

## 2014-01-24 DIAGNOSIS — F331 Major depressive disorder, recurrent, moderate: Secondary | ICD-10-CM
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
## 2014-01-24 NOTE — Patient Instructions (Signed)
Discussed orally 

## 2014-01-26 ENCOUNTER — Ambulatory Visit (HOSPITAL_COMMUNITY): Payer: Self-pay | Admitting: Psychiatry

## 2014-02-15 ENCOUNTER — Ambulatory Visit (INDEPENDENT_AMBULATORY_CARE_PROVIDER_SITE_OTHER): Payer: No Typology Code available for payment source | Admitting: Psychiatry

## 2014-02-15 DIAGNOSIS — F331 Major depressive disorder, recurrent, moderate: Secondary | ICD-10-CM

## 2014-02-15 DIAGNOSIS — F4310 Post-traumatic stress disorder, unspecified: Secondary | ICD-10-CM

## 2014-02-15 NOTE — Patient Instructions (Signed)
Discussed orally 
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
## 2014-02-25 ENCOUNTER — Ambulatory Visit (INDEPENDENT_AMBULATORY_CARE_PROVIDER_SITE_OTHER): Payer: No Typology Code available for payment source | Admitting: Psychiatry

## 2014-02-25 ENCOUNTER — Encounter (HOSPITAL_COMMUNITY): Payer: Self-pay | Admitting: Psychiatry

## 2014-02-25 VITALS — BP 122/78 | HR 77 | Ht 75.0 in | Wt 209.0 lb

## 2014-02-25 DIAGNOSIS — F4310 Post-traumatic stress disorder, unspecified: Secondary | ICD-10-CM

## 2014-02-25 DIAGNOSIS — F331 Major depressive disorder, recurrent, moderate: Secondary | ICD-10-CM

## 2014-02-25 DIAGNOSIS — F411 Generalized anxiety disorder: Secondary | ICD-10-CM

## 2014-02-25 DIAGNOSIS — F1019 Alcohol abuse with unspecified alcohol-induced disorder: Secondary | ICD-10-CM

## 2014-02-25 MED ORDER — GABAPENTIN 100 MG PO CAPS: 100.0000 mg | ORAL_CAPSULE | Freq: Three times a day (TID) | ORAL | Status: DC

## 2014-02-25 MED ORDER — RISPERIDONE 1 MG PO TABS: 1.0000 mg | ORAL_TABLET | Freq: Two times a day (BID) | ORAL | Status: DC

## 2014-02-25 MED ORDER — TRAZODONE HCL 100 MG PO TABS: 200.0000 mg | ORAL_TABLET | Freq: Every day | ORAL | Status: DC

## 2014-02-25 MED ORDER — HYDROXYZINE HCL 50 MG PO TABS: 50.0000 mg | ORAL_TABLET | Freq: Three times a day (TID) | ORAL | Status: DC

## 2014-02-25 MED ORDER — SERTRALINE HCL 100 MG PO TABS: ORAL_TABLET | ORAL | Status: DC

## 2014-02-25 MED ORDER — PRAZOSIN HCL 5 MG PO CAPS: 5.0000 mg | ORAL_CAPSULE | Freq: Every day | ORAL | Status: DC
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
## 2014-03-15 ENCOUNTER — Ambulatory Visit (INDEPENDENT_AMBULATORY_CARE_PROVIDER_SITE_OTHER): Payer: BC Managed Care – PPO | Admitting: Psychiatry

## 2014-03-15 DIAGNOSIS — F4310 Post-traumatic stress disorder, unspecified: Secondary | ICD-10-CM

## 2014-03-15 DIAGNOSIS — F331 Major depressive disorder, recurrent, moderate: Secondary | ICD-10-CM

## 2014-03-15 NOTE — Patient Instructions (Signed)
Discussed orally 
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
## 2014-04-26 ENCOUNTER — Telehealth (HOSPITAL_COMMUNITY): Payer: Self-pay | Admitting: *Deleted

## 2014-04-26 NOTE — Telephone Encounter (Signed)
Phone call from patient, he is in the middle of switching insurance and will reschedule when that is done.

## 2014-05-30 ENCOUNTER — Encounter (HOSPITAL_COMMUNITY): Payer: Self-pay | Admitting: Psychiatry

## 2014-05-30 ENCOUNTER — Ambulatory Visit (INDEPENDENT_AMBULATORY_CARE_PROVIDER_SITE_OTHER): Payer: BLUE CROSS/BLUE SHIELD | Admitting: Psychiatry

## 2014-05-30 VITALS — BP 123/76 | HR 74 | Ht 75.0 in | Wt 210.2 lb

## 2014-05-30 DIAGNOSIS — F411 Generalized anxiety disorder: Secondary | ICD-10-CM

## 2014-05-30 DIAGNOSIS — F331 Major depressive disorder, recurrent, moderate: Secondary | ICD-10-CM

## 2014-05-30 DIAGNOSIS — F4310 Post-traumatic stress disorder, unspecified: Secondary | ICD-10-CM

## 2014-05-30 MED ORDER — RISPERIDONE 1 MG PO TABS: 1.0000 mg | ORAL_TABLET | Freq: Two times a day (BID) | ORAL | Status: DC

## 2014-05-30 MED ORDER — TRAZODONE HCL 100 MG PO TABS: 200.0000 mg | ORAL_TABLET | Freq: Every day | ORAL | Status: DC

## 2014-05-30 MED ORDER — HYDROXYZINE HCL 50 MG PO TABS: 50.0000 mg | ORAL_TABLET | Freq: Three times a day (TID) | ORAL | Status: AC

## 2014-05-30 MED ORDER — GABAPENTIN 100 MG PO CAPS: 100.0000 mg | ORAL_CAPSULE | Freq: Three times a day (TID) | ORAL | Status: DC

## 2014-05-30 MED ORDER — SERTRALINE HCL 100 MG PO TABS: ORAL_TABLET | ORAL | Status: DC

## 2014-05-30 MED ORDER — PRAZOSIN HCL 5 MG PO CAPS: 5.0000 mg | ORAL_CAPSULE | Freq: Every day | ORAL | Status: DC
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
## 2014-06-03 ENCOUNTER — Ambulatory Visit (INDEPENDENT_AMBULATORY_CARE_PROVIDER_SITE_OTHER): Payer: BLUE CROSS/BLUE SHIELD | Admitting: Psychiatry

## 2014-06-03 DIAGNOSIS — F411 Generalized anxiety disorder: Secondary | ICD-10-CM

## 2014-06-03 DIAGNOSIS — F4310 Post-traumatic stress disorder, unspecified: Secondary | ICD-10-CM
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
## 2014-06-03 NOTE — Patient Instructions (Signed)
Discussed orally 

## 2014-06-17 ENCOUNTER — Ambulatory Visit (HOSPITAL_COMMUNITY): Payer: Self-pay | Admitting: Psychiatry

## 2014-06-27 ENCOUNTER — Ambulatory Visit (INDEPENDENT_AMBULATORY_CARE_PROVIDER_SITE_OTHER): Payer: BLUE CROSS/BLUE SHIELD | Admitting: Psychiatry

## 2014-06-27 DIAGNOSIS — F4310 Post-traumatic stress disorder, unspecified: Secondary | ICD-10-CM

## 2014-06-27 DIAGNOSIS — F411 Generalized anxiety disorder: Secondary | ICD-10-CM

## 2014-06-27 NOTE — Patient Instructions (Signed)
Discussed orally 
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
## 2014-07-11 ENCOUNTER — Ambulatory Visit (INDEPENDENT_AMBULATORY_CARE_PROVIDER_SITE_OTHER): Payer: BLUE CROSS/BLUE SHIELD | Admitting: Psychiatry

## 2014-07-11 DIAGNOSIS — F411 Generalized anxiety disorder: Secondary | ICD-10-CM

## 2014-07-11 NOTE — Patient Instructions (Signed)
Discussed orally 
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
## 2014-08-01 ENCOUNTER — Telehealth (HOSPITAL_COMMUNITY): Payer: Self-pay | Admitting: *Deleted

## 2014-08-15 ENCOUNTER — Telehealth (HOSPITAL_COMMUNITY): Payer: Self-pay | Admitting: *Deleted

## 2014-08-15 NOTE — Telephone Encounter (Signed)
he has a new job and only gets his schedule a week at a time.    As soon as he gets another schedule he will call to reschedule.

## 2014-08-26 ENCOUNTER — Ambulatory Visit (HOSPITAL_COMMUNITY): Payer: Self-pay | Admitting: Psychiatry

## 2014-10-20 ENCOUNTER — Ambulatory Visit (HOSPITAL_COMMUNITY): Payer: Self-pay | Admitting: Psychiatry

## 2014-10-21 ENCOUNTER — Telehealth (HOSPITAL_COMMUNITY): Payer: Self-pay | Admitting: *Deleted

## 2014-11-08 ENCOUNTER — Encounter (HOSPITAL_COMMUNITY): Payer: Self-pay | Admitting: Psychiatry

## 2014-11-08 ENCOUNTER — Ambulatory Visit (INDEPENDENT_AMBULATORY_CARE_PROVIDER_SITE_OTHER): Payer: BLUE CROSS/BLUE SHIELD | Admitting: Psychiatry

## 2014-11-08 VITALS — BP 166/105 | HR 105 | Wt 210.4 lb

## 2014-11-08 DIAGNOSIS — F339 Major depressive disorder, recurrent, unspecified: Secondary | ICD-10-CM | POA: Diagnosis not present

## 2014-11-08 DIAGNOSIS — F4310 Post-traumatic stress disorder, unspecified: Secondary | ICD-10-CM | POA: Diagnosis not present

## 2014-11-08 DIAGNOSIS — F411 Generalized anxiety disorder: Secondary | ICD-10-CM

## 2014-11-08 DIAGNOSIS — F1010 Alcohol abuse, uncomplicated: Secondary | ICD-10-CM | POA: Diagnosis not present

## 2014-11-08 DIAGNOSIS — F331 Major depressive disorder, recurrent, moderate: Secondary | ICD-10-CM

## 2014-11-08 MED ORDER — RISPERIDONE 1 MG PO TABS: 1.0000 mg | ORAL_TABLET | Freq: Two times a day (BID) | ORAL | Status: DC

## 2014-11-08 MED ORDER — PRAZOSIN HCL 5 MG PO CAPS: 5.0000 mg | ORAL_CAPSULE | Freq: Every day | ORAL | Status: DC

## 2014-11-08 MED ORDER — GABAPENTIN 100 MG PO CAPS: 100.0000 mg | ORAL_CAPSULE | Freq: Three times a day (TID) | ORAL | Status: DC

## 2014-11-08 MED ORDER — TRAZODONE HCL 100 MG PO TABS: 200.0000 mg | ORAL_TABLET | Freq: Every day | ORAL | Status: DC

## 2014-11-08 MED ORDER — SERTRALINE HCL 100 MG PO TABS: ORAL_TABLET | ORAL | Status: DC
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
## 2014-11-23 ENCOUNTER — Telehealth (HOSPITAL_COMMUNITY): Payer: Self-pay | Admitting: *Deleted

## 2015-02-08 ENCOUNTER — Encounter (HOSPITAL_COMMUNITY): Payer: Self-pay | Admitting: Psychiatry

## 2015-02-08 ENCOUNTER — Ambulatory Visit (INDEPENDENT_AMBULATORY_CARE_PROVIDER_SITE_OTHER): Payer: BLUE CROSS/BLUE SHIELD | Admitting: Psychiatry

## 2015-02-08 VITALS — BP 131/75 | HR 115 | Ht 75.0 in | Wt 211.4 lb

## 2015-02-08 DIAGNOSIS — F4310 Post-traumatic stress disorder, unspecified: Secondary | ICD-10-CM

## 2015-02-08 DIAGNOSIS — F1010 Alcohol abuse, uncomplicated: Secondary | ICD-10-CM

## 2015-02-08 DIAGNOSIS — F411 Generalized anxiety disorder: Secondary | ICD-10-CM

## 2015-02-08 DIAGNOSIS — F331 Major depressive disorder, recurrent, moderate: Secondary | ICD-10-CM

## 2015-02-08 MED ORDER — RISPERIDONE 1 MG PO TABS: 1.0000 mg | ORAL_TABLET | Freq: Two times a day (BID) | ORAL | Status: DC

## 2015-02-08 MED ORDER — GABAPENTIN 100 MG PO CAPS: 100.0000 mg | ORAL_CAPSULE | Freq: Three times a day (TID) | ORAL | Status: DC

## 2015-02-08 MED ORDER — SERTRALINE HCL 100 MG PO TABS: ORAL_TABLET | ORAL | Status: DC

## 2015-02-08 MED ORDER — PRAZOSIN HCL 5 MG PO CAPS: 5.0000 mg | ORAL_CAPSULE | Freq: Every day | ORAL | Status: DC

## 2015-02-08 MED ORDER — TRAZODONE HCL 100 MG PO TABS: 200.0000 mg | ORAL_TABLET | Freq: Every day | ORAL | Status: DC
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
## 2015-05-09 ENCOUNTER — Encounter (HOSPITAL_COMMUNITY): Payer: Self-pay | Admitting: Psychiatry

## 2015-05-09 ENCOUNTER — Ambulatory Visit (INDEPENDENT_AMBULATORY_CARE_PROVIDER_SITE_OTHER): Payer: BLUE CROSS/BLUE SHIELD | Admitting: Psychiatry

## 2015-05-09 VITALS — BP 157/104 | HR 89 | Ht 75.0 in | Wt 197.2 lb

## 2015-05-09 DIAGNOSIS — F331 Major depressive disorder, recurrent, moderate: Secondary | ICD-10-CM

## 2015-05-09 DIAGNOSIS — F4310 Post-traumatic stress disorder, unspecified: Secondary | ICD-10-CM

## 2015-05-09 DIAGNOSIS — F411 Generalized anxiety disorder: Secondary | ICD-10-CM | POA: Diagnosis not present

## 2015-05-09 MED ORDER — TRAZODONE HCL 100 MG PO TABS: 200.0000 mg | ORAL_TABLET | Freq: Every day | ORAL | Status: DC

## 2015-05-09 MED ORDER — RISPERIDONE 1 MG PO TABS: 1.0000 mg | ORAL_TABLET | Freq: Two times a day (BID) | ORAL | Status: DC

## 2015-05-09 MED ORDER — PRAZOSIN HCL 5 MG PO CAPS: 5.0000 mg | ORAL_CAPSULE | Freq: Every day | ORAL | Status: DC

## 2015-05-09 MED ORDER — GABAPENTIN 100 MG PO CAPS: 100.0000 mg | ORAL_CAPSULE | Freq: Three times a day (TID) | ORAL | Status: DC

## 2015-05-09 MED ORDER — SERTRALINE HCL 100 MG PO TABS: ORAL_TABLET | ORAL | Status: DC
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
## 2015-07-14 ENCOUNTER — Telehealth (HOSPITAL_COMMUNITY): Payer: Self-pay | Admitting: *Deleted

## 2015-07-14 NOTE — Telephone Encounter (Signed)
Called pt to resch appt for April 18th due to provider being out of office. Pt mother picked up call and lmtcb with her and number was provided

## 2015-07-28 ENCOUNTER — Other Ambulatory Visit (HOSPITAL_COMMUNITY): Payer: Self-pay | Admitting: Psychiatry

## 2015-07-28 ENCOUNTER — Telehealth (HOSPITAL_COMMUNITY): Payer: Self-pay | Admitting: *Deleted

## 2015-07-28 DIAGNOSIS — F411 Generalized anxiety disorder: Secondary | ICD-10-CM

## 2015-07-28 DIAGNOSIS — F331 Major depressive disorder, recurrent, moderate: Secondary | ICD-10-CM

## 2015-07-28 MED ORDER — TRAZODONE HCL 100 MG PO TABS: 200.0000 mg | ORAL_TABLET | Freq: Every day | ORAL | Status: AC

## 2015-07-28 MED ORDER — PRAZOSIN HCL 5 MG PO CAPS: 5.0000 mg | ORAL_CAPSULE | Freq: Every day | ORAL | Status: AC

## 2015-07-28 MED ORDER — RISPERIDONE 1 MG PO TABS: 1.0000 mg | ORAL_TABLET | Freq: Two times a day (BID) | ORAL | Status: AC

## 2015-07-28 MED ORDER — SERTRALINE HCL 100 MG PO TABS: ORAL_TABLET | ORAL | Status: AC

## 2015-07-28 MED ORDER — GABAPENTIN 100 MG PO CAPS: 100.0000 mg | ORAL_CAPSULE | Freq: Three times a day (TID) | ORAL | Status: AC

## 2015-07-28 NOTE — Telephone Encounter (Signed)
noted 

## 2015-07-28 NOTE — Telephone Encounter (Signed)
sent 

## 2015-07-28 NOTE — Telephone Encounter (Signed)
Pt was schedule for f/u on 08-08-15 but had to be rescheduled due to provider out of office. Per pt chart, pt Gabapentin 100 mg, Minipress 5 mg, Risperidone 1 mg, Zoloft 100 mg, and Trazodone was last filled on 05-09-15 with 2 refills and pt will not have refills to last him until his next appt 08-16-15. Pt number is [phone].

## 2015-08-08 ENCOUNTER — Ambulatory Visit (HOSPITAL_COMMUNITY): Payer: Self-pay | Admitting: Psychiatry

## 2015-08-16 ENCOUNTER — Ambulatory Visit (HOSPITAL_COMMUNITY): Payer: Self-pay | Admitting: Psychiatry

## 2015-08-16 ENCOUNTER — Encounter (HOSPITAL_COMMUNITY): Payer: Self-pay | Admitting: Psychiatry

## 2016-11-20 DEATH — deceased
